# Patient Record
Sex: Female | Born: 1975 | Race: White | Hispanic: No | Marital: Married | Smoking: Former smoker
Health system: Southern US, Community
[De-identification: ages and names within clinical notes are randomized; demographics above are authoritative.]

## PROBLEM LIST (undated history)

## (undated) HISTORY — PX: COLONOSCOPY: SHX174

## (undated) HISTORY — PX: BREAST LUMPECTOMY: SHX2

---

## 2000-08-15 ENCOUNTER — Other Ambulatory Visit: Admission: RE | Admit: 2000-08-15 | Discharge: 2000-08-15 | Payer: Self-pay | Admitting: Obstetrics and Gynecology

## 2001-08-29 ENCOUNTER — Other Ambulatory Visit: Admission: RE | Admit: 2001-08-29 | Discharge: 2001-08-29 | Payer: Self-pay | Admitting: Obstetrics and Gynecology

## 2002-09-01 ENCOUNTER — Other Ambulatory Visit: Admission: RE | Admit: 2002-09-01 | Discharge: 2002-09-01 | Payer: Self-pay | Admitting: Obstetrics and Gynecology

## 2003-11-09 ENCOUNTER — Other Ambulatory Visit: Admission: RE | Admit: 2003-11-09 | Discharge: 2003-11-09 | Payer: Self-pay | Admitting: Obstetrics and Gynecology

## 2004-11-24 ENCOUNTER — Other Ambulatory Visit: Admission: RE | Admit: 2004-11-24 | Discharge: 2004-11-24 | Payer: Self-pay | Admitting: Obstetrics and Gynecology

## 2008-02-18 ENCOUNTER — Encounter: Admission: RE | Admit: 2008-02-18 | Discharge: 2008-02-18 | Payer: Self-pay | Admitting: Obstetrics and Gynecology

## 2008-03-11 ENCOUNTER — Ambulatory Visit (HOSPITAL_BASED_OUTPATIENT_CLINIC_OR_DEPARTMENT_OTHER): Admission: RE | Admit: 2008-03-11 | Discharge: 2008-03-11 | Payer: Self-pay | Admitting: General Surgery

## 2008-03-11 ENCOUNTER — Encounter (INDEPENDENT_AMBULATORY_CARE_PROVIDER_SITE_OTHER): Payer: Self-pay | Admitting: General Surgery

## 2008-03-16 ENCOUNTER — Encounter: Admission: RE | Admit: 2008-03-16 | Discharge: 2008-03-16 | Payer: Self-pay | Admitting: Obstetrics and Gynecology

## 2009-02-18 ENCOUNTER — Encounter: Admission: RE | Admit: 2009-02-18 | Discharge: 2009-02-18 | Payer: Self-pay | Admitting: Obstetrics and Gynecology

## 2010-02-08 ENCOUNTER — Inpatient Hospital Stay (HOSPITAL_COMMUNITY): Admission: AD | Admit: 2010-02-08 | Discharge: 2010-02-11 | Payer: Self-pay | Admitting: Obstetrics and Gynecology

## 2010-08-18 LAB — CBC
HCT: 32.4 % — ABNORMAL LOW (ref 36.0–46.0)
HCT: 38 % (ref 36.0–46.0)
Hemoglobin: 12.9 g/dL (ref 12.0–15.0)
MCH: 31.2 pg (ref 26.0–34.0)
MCH: 31.2 pg (ref 26.0–34.0)
MCHC: 33.7 g/dL (ref 30.0–36.0)
MCHC: 34.1 g/dL (ref 30.0–36.0)
MCV: 92.6 fL (ref 78.0–100.0)
Platelets: 148 10*3/uL — ABNORMAL LOW (ref 150–400)
RDW: 14.1 % (ref 11.5–15.5)
RDW: 14.2 % (ref 11.5–15.5)
WBC: 13.9 10*3/uL — ABNORMAL HIGH (ref 4.0–10.5)

## 2010-10-18 NOTE — Op Note (Signed)
NAMETREZURE, CRONK                ACCOUNT NO.:  [id number]   MEDICAL RECORD NO.:  [id number]          PATIENT TYPE:  AMB   LOCATION:  DSC                          FACILITY:  [facility]   PHYSICIAN:  Almond Lint, MD       DATE OF BIRTH:  Nov 18, 1975   DATE OF PROCEDURE:  03/11/2008  DATE OF DISCHARGE:                               OPERATIVE REPORT   PREOPERATIVE DIAGNOSIS:  Right breast mass.   POSTOPERATIVE DIAGNOSIS:  Right breast mass.   PROCEDURE PERFORMED:  Excision of right breast mass at 4 o'clock in the  lower inner quadrant.   SURGEON:  Almond Lint, MD   ASSISTANT:  None.   ANESTHESIA:  General and local.   FINDINGS:  Fatty right breast mass.   SPECIMEN:  Breast mass to pathology   ESTIMATED BLOOD LOSS:  Minimal.   COMPLICATIONS:  None.   PROCEDURE:  Ms. Brymer was identified in the holding area and taken to the  operating room where she was placed supine on the operating room table.  General anesthesia was induced.  Her right breast was prepped and draped  in a sterile fashion.  The presurgical time-out checklist was performed.  The lesion was marked and a potential mastectomy incision was marked.  A  curvilinear oblique incision was made on the lower inner quadrant of the  right breast.  The breast mass was immediately apparent under the skin.  Skin flaps were created to get around the mass.  This was done by using  skin tucks and using the Bovie electrocautery.  The mass was elevated  with an Allis and removed with the Bovie electrocautery.  The wound was  inspected for hemostasis which was the case.  The wound was then  irrigated.  There was another small site of oozing which was coagulated.  The wound was irrigated again.  The skin was then reapproximated using 3-  0 Vicryl interrupted and 4-0 Monocryl in a running fashion.  The skin  was cleaned and dressed with Dermabond.  The patient tolerated the  procedure well, was awakened and taken to the PACU in stable  condition.  The needle and sponge counts were correct x2.      Almond Lint, MD  Electronically Signed    FB/MEDQ  D:  03/11/2008  T:  03/12/2008  Job:  [id number]

## 2011-03-27 ENCOUNTER — Other Ambulatory Visit: Payer: Self-pay | Admitting: Obstetrics and Gynecology

## 2011-03-27 DIAGNOSIS — Z1231 Encounter for screening mammogram for malignant neoplasm of breast: Secondary | ICD-10-CM

## 2011-05-03 ENCOUNTER — Ambulatory Visit: Payer: Self-pay

## 2011-06-08 ENCOUNTER — Ambulatory Visit
Admission: RE | Admit: 2011-06-08 | Discharge: 2011-06-08 | Disposition: A | Discharge status: Home / Self Care | Payer: Managed Care, Other (non HMO) | Source: Ambulatory Visit | Attending: Obstetrics and Gynecology | Admitting: Obstetrics and Gynecology

## 2011-06-08 DIAGNOSIS — Z1231 Encounter for screening mammogram for malignant neoplasm of breast: Secondary | ICD-10-CM

## 2013-07-09 ENCOUNTER — Other Ambulatory Visit: Payer: Self-pay | Admitting: Obstetrics and Gynecology

## 2013-07-09 DIAGNOSIS — R928 Other abnormal and inconclusive findings on diagnostic imaging of breast: Secondary | ICD-10-CM

## 2013-07-21 ENCOUNTER — Ambulatory Visit
Admission: RE | Admit: 2013-07-21 | Discharge: 2013-07-21 | Disposition: A | Discharge status: Home / Self Care | Payer: Managed Care, Other (non HMO) | Source: Ambulatory Visit | Attending: Obstetrics and Gynecology | Admitting: Obstetrics and Gynecology

## 2013-07-21 DIAGNOSIS — R928 Other abnormal and inconclusive findings on diagnostic imaging of breast: Secondary | ICD-10-CM

## 2014-07-28 ENCOUNTER — Other Ambulatory Visit: Payer: Self-pay | Admitting: Obstetrics and Gynecology

## 2014-07-29 LAB — CYTOLOGY - PAP

## 2017-03-07 ENCOUNTER — Ambulatory Visit (INDEPENDENT_AMBULATORY_CARE_PROVIDER_SITE_OTHER): Payer: 59 | Admitting: Physician Assistant

## 2017-03-07 ENCOUNTER — Encounter: Payer: Self-pay | Admitting: Physician Assistant

## 2017-03-07 ENCOUNTER — Ambulatory Visit (INDEPENDENT_AMBULATORY_CARE_PROVIDER_SITE_OTHER): Payer: 59

## 2017-03-07 VITALS — BP 116/82 | HR 90 | Temp 99.0°F | Resp 16 | Ht 63.0 in | Wt 135.2 lb

## 2017-03-07 DIAGNOSIS — S99911A Unspecified injury of right ankle, initial encounter: Secondary | ICD-10-CM

## 2017-03-07 DIAGNOSIS — S82831A Other fracture of upper and lower end of right fibula, initial encounter for closed fracture: Secondary | ICD-10-CM

## 2017-03-07 NOTE — Patient Instructions (Signed)
Please keep the ankle boot on with any ambulation or walking.  I need to see you in 2 weeks to recheck this ankle fracture.  If this is not healing well, we will have to send you to orthopedist, but prepare to be in boot for 6 weeks. Nondisplaced Fibular Ankle Fracture Treated With Immobilization, Adult A nondisplaced fibular ankle fracture is a simple break of the bottom of the fibula (lateral malleolus). The fibula is a bone in the lower leg, between the knee and the foot. In a nondisplaced fracture, the pieces of the broken bone line up with each other and are not out of place. This condition usually does not need surgery and can be treated with a splint or cast. What are the causes? This condition may be caused by:  A hard, direct hit (blow) or injury to the side of the leg.  A powerful twisting or rotating movement.  Rolling the ankle.  Falling or tripping.  What increases the risk? You are more likely to develop this condition if:  You play sports that involve a lot of running and pivoting, such as basketball.  You play impact sports, such as football or soccer.  You smoke.  You have diabetes.  You have a history of ankle fractures.  You are obese.  What are the signs or symptoms? Symptoms of this condition include:  Severe pain that begins immediately after the injury.  Bruising.  Swelling.  Inability to put weight on the injured ankle.  An ankle that is tender to the touch.  How is this diagnosed? This condition is diagnosed based on:  Your medical history.  A physical exam.  Imaging tests to confirm the fracture and to evaluate the extent of the injury. These tests may include: ? X-rays. ? Stress X-ray. During this test, your health care provider will put pressure on your ankle while taking an X-ray. This will help to determine whether your ankle is stable. ? CT scan. ? MRI.  How is this treated? This condition may be treated with:  A  splint.  Icing and raising (elevating) the ankle.  A cast.  A removable cast or walking "boot."  Crutches. These may be needed to help you get around.  Follow these instructions at home: Managing pain, stiffness, and swelling  If directed, put ice on the injured area. ? If you have a removable splint or cast, remove it as told by your health care provider. ? Put ice in a plastic bag. ? Place a towel between your skin and the bag, or between your cast and the bag. ? Leave the ice on for 20 minutes, 2-3 times a day.  Raise (elevate) the injured area above the level of your heart while you are sitting or lying down.  Move your toes often to avoid stiffness and to lessen swelling.  Use crutches as directed. Resume walking without crutches as directed by your health care provider or when you are comfortable doing that. If you have a removable splint or cast:  Wear the removable splint or cast as told by your health care provider. Remove it only as told by your health care provider.  Loosen the splint or cast if your toes tingle, become numb, or turn cold and blue.  Keep the splint or cast clean.  If the splint or cast is not waterproof: ? Do not let it get wet. ? Cover it with a watertight covering when you take a bath or a shower. If  you have a cast that cannot be removed:  Do not stick anything inside the cast to scratch your skin. Doing that increases your risk of infection.  Check the skin around the cast every day. Contact your health care provider if you notice any redness, irritation, or swelling.  You may put lotion on dry skin around the edges of the cast. Do not put lotion on the skin underneath the cast.  Keep the cast clean.  Do not break off edges or trim your cast.  If the cast is not waterproof: ? Do not let it get wet. ? Cover it with a watertight covering when you take a bath or a shower. Activity  Do exercises and stretches as told by your health care  provider.  Ask your health care provider when it is safe to drive if you have a cast or splint on your ankle.  Do not drive or use heavy machinery while taking prescription pain medicine. General instructions  Take over-the-counter and prescription medicines only as told by your health care provider.  Do not take baths, swim, or use a hot tub until your health care provider approves. Ask your health care provider if you can take showers. You may only be allowed to take sponge baths for bathing.  Do not use the injured leg to support your body weight until your health care provider says that you can. Use crutches as told by your health care provider.  Do not use any products that contain nicotine or tobacco, such as cigarettes and e-cigarettes. These can delay bone healing. If you need help quitting, ask your health care provider.  Keep all follow-up visits as told by your health care provider. This is important. Contact a health care provider if:  Your cast gets damaged or it breaks.  Your pain does not get better with medicine. Get help right away if:  You develop severe pain or more swelling in your ankle or foot that cannot be controlled with medicines.  Your skin or nails below the injury turn blue or gray, feel cold, or become numb.  The skin under your cast burns or stings.  There is a bad smell or pus coming from under the cast.  You cannot move your toes. Summary  A nondisplaced fibular ankle fracture is a simple break of the bottom of a bone in the lower leg (fibula).  This condition may be treated with a splint, icing and elevation, a cast, or a removable cast or walking "boot." You may also need crutches to get around while your ankle heals.  To help manage pain, stiffness, swelling, put ice on the injured area as directed by your health care provider.  You should not use the injured leg to support your body weight until your health care provider says that you can.  Use crutches as told by your health care provider. This information is not intended to replace advice given to you by your health care provider. Make sure you discuss any questions you have with your health care provider. Document Released: 02/11/2002 Document Revised: 05/01/2016 Document Reviewed: 05/01/2016 Elsevier Interactive Patient Education  2017 [employer].

## 2017-03-07 NOTE — Progress Notes (Signed)
PRIMARY CARE AT [facility] [address] [place] [address] [phone]  Date:  03/07/2017   Name:  Katie Snow   DOB:  11-08-1975   MRN:  [id number]  PCP:  Garnetta Buddy, PA    History of Present Illness:  Katie Snow is a 41 y.o. female patient who presents to PCP with  Chief Complaint  Patient presents with  . Ankle Injury    stepped on acorn while running     Morning, running and slipped, inverting her ankle.  Pain and swelling insued.  She had to trek 1.5 miles back walking.  She has pain on the outside of her ankle.  She reports that she has never injured her ankle before.  No numbness or tingling.     There are no active problems to display for this patient.   History reviewed. No pertinent past medical history.  History reviewed. No pertinent surgical history.  Social History  Substance Use Topics  . Smoking status: Never Smoker  . Smokeless tobacco: Never Used  . Alcohol use Not on file    History reviewed. No pertinent family history.  No Known Allergies  Medication list has been reviewed and updated.  No current outpatient prescriptions on file prior to visit.   No current facility-administered medications on file prior to visit.     ROS ROS otherwise unremarkable unless listed above.  Physical Examination: BP 116/82   Pulse 90   Temp 99 F (37.2 C)   Resp 16   Ht  (1.6 m)   Wt 135 lb 3.2 oz (61.3 kg)   SpO2 98%   BMI 23.95 kg/m  Ideal Body Weight: Weight in (lb) to have BMI = 25: 140.8  Physical Exam  Constitutional: She is oriented to person, place, and time. She appears well-developed and well-nourished. No distress.  HENT:  Head: Normocephalic and atraumatic.  Right Ear: External ear normal.  Left Ear: External ear normal.  Eyes: Pupils are equal, round, and reactive to light. Conjunctivae and EOM are normal.  Cardiovascular: Normal rate.   Pulmonary/Chest: Effort normal. No respiratory distress.  Musculoskeletal:      Right ankle: She exhibits decreased range of motion and swelling. She exhibits no ecchymosis. Tenderness. Lateral malleolus tenderness found. Achilles tendon normal.   tender at the anterior area of the right lateral malleolus.  pain with passive eversion and flexion.  Neurological: She is alert and oriented to person, place, and time.  Skin: She is not diaphoretic.  Psychiatric: She has a normal mood and affect. Her behavior is normal.    Dg Ankle Complete Right  Result Date: 03/07/2017 CLINICAL DATA:  Right ankle pain after inverting injury. EXAM: RIGHT ANKLE - COMPLETE 3+ VIEW COMPARISON:  None. FINDINGS: There is a oblique nondisplaced fracture of the distal fibular metaphysis with overlying soft tissue swelling. There is no evidence of arthropathy or other focal bone abnormality. Soft tissues are unremarkable. IMPRESSION: Oblique nondisplaced fracture of the distal fibular metaphysis with overlying soft tissue swelling. Electronically Signed   By: Elige Ko   On: 03/07/2017 15:42     Assessment and Plan: Katie Snow is a 41 y.o. female who is here today for cc of  Chief Complaint  Patient presents with  . Ankle Injury    stepped on acorn while running   Placed in boot.  Advised must be worn for any ambulation.  She will return in 2 weeks for recheck xrays and followup.  First offered ortho  referral, but she would like to follow this here in clinic.  If healing is not prevalent after 2 weeks, may seek referral.   Other closed fracture of distal end of right fibula, initial encounter  Injury of right ankle, initial encounter - Plan: DG Ankle Complete Right  Trena Platt, PA-C [facility] 10/7/201810:55 AM

## 2017-03-26 ENCOUNTER — Ambulatory Visit (INDEPENDENT_AMBULATORY_CARE_PROVIDER_SITE_OTHER): Payer: 59 | Admitting: Physician Assistant

## 2017-03-26 ENCOUNTER — Encounter: Payer: Self-pay | Admitting: Physician Assistant

## 2017-03-26 ENCOUNTER — Ambulatory Visit (INDEPENDENT_AMBULATORY_CARE_PROVIDER_SITE_OTHER): Payer: 59

## 2017-03-26 VITALS — BP 114/62 | HR 72 | Temp 98.6°F | Resp 16 | Ht 63.0 in | Wt 131.0 lb

## 2017-03-26 DIAGNOSIS — S82831A Other fracture of upper and lower end of right fibula, initial encounter for closed fracture: Secondary | ICD-10-CM | POA: Diagnosis not present

## 2017-03-26 DIAGNOSIS — M25572 Pain in left ankle and joints of left foot: Secondary | ICD-10-CM

## 2017-03-26 NOTE — Progress Notes (Signed)
PRIMARY CARE AT [facility] [address] [place] [address] [phone]  Date:  03/26/2017   Name:  Katie Snow   DOB:  11-Jul-1975   MRN:  [id number]  PCP:  Garnetta Buddy, PA    History of Present Illness:  Katie Snow is a 41 y.o. female patient who presents to PCP with  Chief Complaint  Patient presents with  . Follow-up    ankle     Left ankle fracture of the distal fibular metaphysis that occurred 2.5 weeks ago.  She was placed in a cam walker boot for ambulation only.  She states that she has kept up with this.  The swelling and pain is minimal she states.     There are no active problems to display for this patient.   History reviewed. No pertinent past medical history.  History reviewed. No pertinent surgical history.  Social History  Substance Use Topics  . Smoking status: Never Smoker  . Smokeless tobacco: Never Used  . Alcohol use Not on file    History reviewed. No pertinent family history.  No Known Allergies  Medication list has been reviewed and updated.  No current outpatient prescriptions on file prior to visit.   No current facility-administered medications on file prior to visit.     ROS ROS otherwise unremarkable unless listed above.  Physical Examination: BP 114/62   Pulse 72   Temp 98.6 F (37 C) (Oral)   Resp 16   Ht 5\' 3"  (1.6 m)   Wt 131 lb (59.4 kg)   SpO2 100%   BMI 23.21 kg/m  Ideal Body Weight: Weight in (lb) to have BMI = 25: 140.8  Physical Exam  Constitutional: She is oriented to person, place, and time. She appears well-developed and well-nourished. No distress.  HENT:  Head: Normocephalic and atraumatic.  Right Ear: External ear normal.  Left Ear: External ear normal.  Eyes: Pupils are equal, round, and reactive to light. Conjunctivae and EOM are normal.  Cardiovascular: Normal rate.   Pulmonary/Chest: Effort normal. No respiratory distress.  Musculoskeletal:  Tender along the distal fibular   Neurological: She is alert and oriented to person, place, and time.  Skin: She is not diaphoretic.  Psychiatric: She has a normal mood and affect. Her behavior is normal.     Dg Ankle Complete Left  Result Date: 03/26/2017 CLINICAL DATA:  Recent fracture EXAM: LEFT ANKLE COMPLETE - 3+ VIEW COMPARISON:  March 07, 2017 FINDINGS: Frontal, oblique, and lateral images obtained. The fracture of the distal fibular metaphysis is again noted without appreciable change in alignment compared to recent study. Alignment is essentially anatomic in this area. There is no appreciable callus currently in this area. No new fracture. Ankle mortise appears intact. No joint effusion. No appreciable joint space narrowing. There is a tiny exostosis along the medial talus. There is a small posterior calcaneal spur. IMPRESSION: Stable nondisplaced fracture distal fibular metaphysis without appreciable callus formation. No new fracture. Ankle mortise appears intact. There is mild soft tissue swelling. No joint effusion. There is a small posterior calcaneal spur. Electronically Signed   By: Bretta Bang III M.D.   On: 03/26/2017 08:58   Dg Ankle Complete Right  Result Date: 03/07/2017 CLINICAL DATA:  Right ankle pain after inverting injury. EXAM: RIGHT ANKLE - COMPLETE 3+ VIEW COMPARISON:  None. FINDINGS: There is a oblique nondisplaced fracture of the distal fibular metaphysis with overlying soft tissue swelling. There is no evidence of arthropathy or other focal  bone abnormality. Soft tissues are unremarkable. IMPRESSION: Oblique nondisplaced fracture of the distal fibular metaphysis with overlying soft tissue swelling. Electronically Signed   By: Elige KoHetal  Patel   On: 03/07/2017 15:42    Assessment and Plan: Denton LankHeather Arnott is a 41 y.o. female who is here today for cc  Chief Complaint  Patient presents with  . Follow-up    ankle  continue to stay in cam walker boot Follow up in 3 weeks for recheck.  Acute left  ankle pain - Plan: DG Ankle Complete Left  Other closed fracture of distal end of right fibula, initial encounter - Plan: DG Ankle Complete Left  Trena PlattStephanie English, PA-C [facility] 10/22/20189:46 AM

## 2017-03-26 NOTE — Patient Instructions (Addendum)
Please continue to stay in the boot when you are ambulating.       IF you received an x-ray today, you will receive an invoice from [facility]. Please contact [facility] at [phone] with questions or concerns regarding your invoice.   IF you received labwork today, you will receive an invoice from [facility]. Please contact [facility] at [phone] with questions or concerns regarding your invoice.   Our billing staff will not be able to assist you with questions regarding bills from these companies.  You will be contacted with the lab results as soon as they are available. The fastest way to get your results is to activate your My Chart account. Instructions are located on the last page of this paperwork. If you have not heard from us regarding the results in 2 weeks, please contact this office.

## 2017-04-23 ENCOUNTER — Ambulatory Visit (INDEPENDENT_AMBULATORY_CARE_PROVIDER_SITE_OTHER): Payer: 59

## 2017-04-23 ENCOUNTER — Other Ambulatory Visit: Payer: Self-pay | Admitting: Physician Assistant

## 2017-04-23 ENCOUNTER — Encounter: Payer: Self-pay | Admitting: Physician Assistant

## 2017-04-23 ENCOUNTER — Other Ambulatory Visit: Payer: Self-pay

## 2017-04-23 ENCOUNTER — Ambulatory Visit (INDEPENDENT_AMBULATORY_CARE_PROVIDER_SITE_OTHER): Payer: 59 | Admitting: Physician Assistant

## 2017-04-23 VITALS — BP 118/80 | HR 76 | Temp 98.1°F | Resp 16 | Ht 63.0 in | Wt 134.0 lb

## 2017-04-23 DIAGNOSIS — S82892A Other fracture of left lower leg, initial encounter for closed fracture: Secondary | ICD-10-CM

## 2017-04-23 NOTE — Progress Notes (Signed)
[facility] [address], [place] [address] [phone]  Date:  04/23/2017   Name:  Katie LankHeather Snow   DOB:  [birth year]   MRN:  [id number]  PCP:  Garnetta BuddyEnglish, Katie D, PA    History of Present Illness:  Katie Snow is a 41 y.o. female patient who presents to PCP with No chief complaint on file.      There are no active problems to display for this patient.   No past medical history on file.  No past surgical history on file.  Social History   Tobacco Use  . Smoking status: Never Smoker  . Smokeless tobacco: Never Used  Substance Use Topics  . Alcohol use: Not on file  . Drug use: Not on file    No family history on file.  No Known Allergies  Medication list has been reviewed and updated.  No current outpatient medications on file prior to visit.   No current facility-administered medications on file prior to visit.     ROS ROS otherwise unremarkable unless listed above.  Physical Examination: There were no vitals taken for this visit. Ideal Body Weight:    Physical Exam  Dg Ankle Complete Left  Result Date: 03/26/2017 CLINICAL DATA:  Recent fracture EXAM: LEFT ANKLE COMPLETE - 3+ VIEW COMPARISON:  March 07, 2017 FINDINGS: Frontal, oblique, and lateral images obtained. The fracture of the distal fibular metaphysis is again noted without appreciable change in alignment compared to recent study. Alignment is essentially anatomic in this area. There is no appreciable callus currently in this area. No new fracture. Ankle mortise appears intact. No joint effusion. No appreciable joint space narrowing. There is a tiny exostosis along the medial talus. There is a small posterior calcaneal spur. IMPRESSION: Stable nondisplaced fracture distal fibular metaphysis without appreciable callus formation. No new fracture. Ankle mortise appears intact. There is mild soft tissue swelling. No joint effusion. There is a small posterior calcaneal spur. Electronically  Signed   By: Bretta BangWilliam  Woodruff III M.D.   On: 03/26/2017 08:58   Dg Ankle Complete Right  Result Date: 04/23/2017 CLINICAL DATA:  Left ankle fracture. EXAM: RIGHT ANKLE - COMPLETE 3+ VIEW COMPARISON:  Radiographs of March 07, 2017. FINDINGS: Stable appearance of nondisplaced fracture involving the distal right fibula. No significant callus formation is seen at this time to suggest healing. Persistent fracture line remains. Joint spaces are intact. No other fracture or bony abnormality is noted. No soft tissue abnormality is noted. IMPRESSION: Stable appearance of nondisplaced distal right fibular fracture. Electronically Signed   By: Lupita RaiderJames  Green Jr, M.D.   On: 04/23/2017 09:06     Assessment and Plan: Katie Snow is a 41 y.o. female who is here today  1. Closed fracture of left ankle, initial encounter - DG Ankle Complete Right   Trena PlattStephanie English, PA-C [facility] 04/23/2017 9:16 AM

## 2017-04-23 NOTE — Patient Instructions (Addendum)
I am referring you to orthopedist.  I would like you to await contact for this appointment.   Continue to stay in the boot.      IF you received an x-ray today, you will receive an invoice from [facility]. Please contact [facility] at [phone] with questions or concerns regarding your invoice.   IF you received labwork today, you will receive an invoice from [facility]. Please contact [facility] at [phone] with questions or concerns regarding your invoice.   Our billing staff will not be able to assist you with questions regarding bills from these companies.  You will be contacted with the lab results as soon as they are available. The fastest way to get your results is to activate your My Chart account. Instructions are located on the last page of this paperwork. If you have not heard from us regarding the results in 2 weeks, please contact this office.

## 2017-04-23 NOTE — Progress Notes (Signed)
[facility] [address], [place] [phone]  Date:  04/23/2017   Name:  Katie LankHeather Snow   DOB:  09/05/1975   MRN:  [id number]  PCP:  Garnetta BuddyEnglish, Ruby Logiudice D, PA    History of Present Illness:  Katie Snow is a 41 y.o. female patient who presents to PCP with  Chief Complaint  Patient presents with  . Follow-up    ankle injury      Patient is here today for recheck of her ankle fracture.  About 6 weeks ago, she was diagnosed with an oblique fracture of her right fibula.  She has been in a boot WB for the last 6 weeks.   Past xrays did not show any calcifications of her ankle fracture.  Continued boot.  She reports that she is not exercising.  She only bears weight in the boot.   She has very little pain but once in a while.    There are no active problems to display for this patient.   No past medical history on file.  No past surgical history on file.  Social History   Tobacco Use  . Smoking status: Never Smoker  . Smokeless tobacco: Never Used  Substance Use Topics  . Alcohol use: Not on file  . Drug use: Not on file    No family history on file.  No Known Allergies  Medication list has been reviewed and updated.  No current outpatient medications on file prior to visit.   No current facility-administered medications on file prior to visit.     ROS ROS otherwise unremarkable unless listed above.  Physical Examination: BP 118/80   Pulse 76   Temp 98.1 F (36.7 C) (Oral)   Resp 16   Ht 5\' 3"  (1.6 m)   Wt 134 lb (60.8 kg)   SpO2 100%   BMI 23.74 kg/m  Ideal Body Weight: Weight in (lb) to have BMI = 25: 140.8  Physical Exam  Constitutional: She is oriented to person, place, and time. She appears well-developed and well-nourished. No distress.  HENT:  Head: Normocephalic and atraumatic.  Right Ear: External ear normal.  Left Ear: External ear normal.  Eyes: Conjunctivae and EOM are normal. Pupils are equal, round, and  reactive to light.  Cardiovascular: Normal rate.  Pulmonary/Chest: Effort normal. No respiratory distress.  Musculoskeletal:  No tenderness of the right ankle upon palpation.  No noticeable swelling.    Neurological: She is alert and oriented to person, place, and time.  Skin: She is not diaphoretic.  Psychiatric: She has a normal mood and affect. Her behavior is normal.    Dg Ankle Complete Left  Result Date: 03/26/2017 CLINICAL DATA:  Recent fracture EXAM: LEFT ANKLE COMPLETE - 3+ VIEW COMPARISON:  March 07, 2017 FINDINGS: Frontal, oblique, and lateral images obtained. The fracture of the distal fibular metaphysis is again noted without appreciable change in alignment compared to recent study. Alignment is essentially anatomic in this area. There is no appreciable callus currently in this area. No new fracture. Ankle mortise appears intact. No joint effusion. No appreciable joint space narrowing. There is a tiny exostosis along the medial talus. There is a small posterior calcaneal spur. IMPRESSION: Stable nondisplaced fracture distal fibular metaphysis without appreciable callus formation. No new fracture. Ankle mortise appears intact. There is mild soft tissue swelling. No joint effusion. There is a small posterior calcaneal spur. Electronically Signed   By: Bretta BangWilliam  Woodruff III M.D.   On: 03/26/2017  08:58   Dg Ankle Complete Left  Result Date: 03/26/2017 CLINICAL DATA:  Recent fracture EXAM: LEFT ANKLE COMPLETE - 3+ VIEW COMPARISON:  March 07, 2017 FINDINGS: Frontal, oblique, and lateral images obtained. The fracture of the distal fibular metaphysis is again noted without appreciable change in alignment compared to recent study. Alignment is essentially anatomic in this area. There is no appreciable callus currently in this area. No new fracture. Ankle mortise appears intact. No joint effusion. No appreciable joint space narrowing. There is a tiny exostosis along the medial talus. There is  a small posterior calcaneal spur. IMPRESSION: Stable nondisplaced fracture distal fibular metaphysis without appreciable callus formation. No new fracture. Ankle mortise appears intact. There is mild soft tissue swelling. No joint effusion. There is a small posterior calcaneal spur. Electronically Signed   By: Bretta BangWilliam  Woodruff III M.D.   On: 03/26/2017 08:58   Dg Ankle Complete Right  Result Date: 04/23/2017 CLINICAL DATA:  Left ankle fracture. EXAM: RIGHT ANKLE - COMPLETE 3+ VIEW COMPARISON:  Radiographs of March 07, 2017. FINDINGS: Stable appearance of nondisplaced fracture involving the distal right fibula. No significant callus formation is seen at this time to suggest healing. Persistent fracture line remains. Joint spaces are intact. No other fracture or bony abnormality is noted. No soft tissue abnormality is noted. IMPRESSION: Stable appearance of nondisplaced distal right fibular fracture. Electronically Signed   By: Lupita RaiderJames  Green Jr, M.D.   On: 04/23/2017 09:06    Assessment and Plan: Katie Snow is a 41 y.o. female who is here today for cc of  Chief Complaint  Patient presents with  . Follow-up    ankle injury   Patient continues to have difficulty with right ankle fracture healing, though stable.  She is very active and athlete.  It would be advantageous for orthopedic consult at this time.  She has inquired of 1 mile marathon in cam boot.  I advised against.   Obtained vitamin d today . Closed fracture of left ankle, initial encounter - Plan: AMB referral to orthopedics, VITAMIN D 25 Hydroxy (Vit-D Deficiency, Fractures), CANCELED: DG Ankle Complete Left  Trena PlattStephanie Fatma Rutten, PA-C [facility] 11/19/20182:05 PM

## 2017-04-24 LAB — VITAMIN D 25 HYDROXY (VIT D DEFICIENCY, FRACTURES): VIT D 25 HYDROXY: 61.1 ng/mL (ref 30.0–100.0)

## 2017-09-12 ENCOUNTER — Encounter: Payer: Self-pay | Admitting: Physician Assistant

## 2018-04-24 DIAGNOSIS — Z803 Family history of malignant neoplasm of breast: Secondary | ICD-10-CM | POA: Diagnosis not present

## 2018-04-24 DIAGNOSIS — Z01419 Encounter for gynecological examination (general) (routine) without abnormal findings: Secondary | ICD-10-CM | POA: Diagnosis not present

## 2018-04-24 DIAGNOSIS — Z1231 Encounter for screening mammogram for malignant neoplasm of breast: Secondary | ICD-10-CM | POA: Diagnosis not present

## 2018-04-24 DIAGNOSIS — Z800 Family history of malignant neoplasm of digestive organs: Secondary | ICD-10-CM | POA: Diagnosis not present

## 2018-04-24 DIAGNOSIS — Z809 Family history of malignant neoplasm, unspecified: Secondary | ICD-10-CM | POA: Diagnosis not present

## 2018-04-24 DIAGNOSIS — Z8042 Family history of malignant neoplasm of prostate: Secondary | ICD-10-CM | POA: Diagnosis not present

## 2018-04-24 DIAGNOSIS — Z6823 Body mass index (BMI) 23.0-23.9, adult: Secondary | ICD-10-CM | POA: Diagnosis not present

## 2018-04-24 DIAGNOSIS — Z801 Family history of malignant neoplasm of trachea, bronchus and lung: Secondary | ICD-10-CM | POA: Diagnosis not present

## 2018-04-24 DIAGNOSIS — E785 Hyperlipidemia, unspecified: Secondary | ICD-10-CM | POA: Diagnosis not present

## 2018-04-24 DIAGNOSIS — Z8049 Family history of malignant neoplasm of other genital organs: Secondary | ICD-10-CM | POA: Diagnosis not present

## 2018-04-30 ENCOUNTER — Other Ambulatory Visit: Payer: Self-pay | Admitting: Obstetrics and Gynecology

## 2018-04-30 DIAGNOSIS — Z803 Family history of malignant neoplasm of breast: Secondary | ICD-10-CM

## 2018-05-22 DIAGNOSIS — Z1211 Encounter for screening for malignant neoplasm of colon: Secondary | ICD-10-CM | POA: Diagnosis not present

## 2018-05-22 DIAGNOSIS — Z800 Family history of malignant neoplasm of digestive organs: Secondary | ICD-10-CM | POA: Diagnosis not present

## 2018-06-12 DIAGNOSIS — Z809 Family history of malignant neoplasm, unspecified: Secondary | ICD-10-CM | POA: Diagnosis not present

## 2018-06-27 DIAGNOSIS — Z800 Family history of malignant neoplasm of digestive organs: Secondary | ICD-10-CM | POA: Diagnosis not present

## 2018-06-27 DIAGNOSIS — Z1211 Encounter for screening for malignant neoplasm of colon: Secondary | ICD-10-CM | POA: Diagnosis not present

## 2018-06-27 DIAGNOSIS — D127 Benign neoplasm of rectosigmoid junction: Secondary | ICD-10-CM | POA: Diagnosis not present

## 2018-06-27 DIAGNOSIS — K635 Polyp of colon: Secondary | ICD-10-CM | POA: Diagnosis not present

## 2018-07-02 DIAGNOSIS — K635 Polyp of colon: Secondary | ICD-10-CM | POA: Diagnosis not present

## 2018-07-02 DIAGNOSIS — Z1211 Encounter for screening for malignant neoplasm of colon: Secondary | ICD-10-CM | POA: Diagnosis not present

## 2018-07-02 DIAGNOSIS — D127 Benign neoplasm of rectosigmoid junction: Secondary | ICD-10-CM | POA: Diagnosis not present

## 2019-02-03 IMAGING — DX DG ANKLE COMPLETE 3+V*L*
4 series · 4 of 4 positions shown · non-contrast
Comparison: March 07, 2017

CLINICAL DATA: Recent fracture

EXAM:
LEFT ANKLE COMPLETE - 3+ VIEW

[ankle obl (1 of 2)]
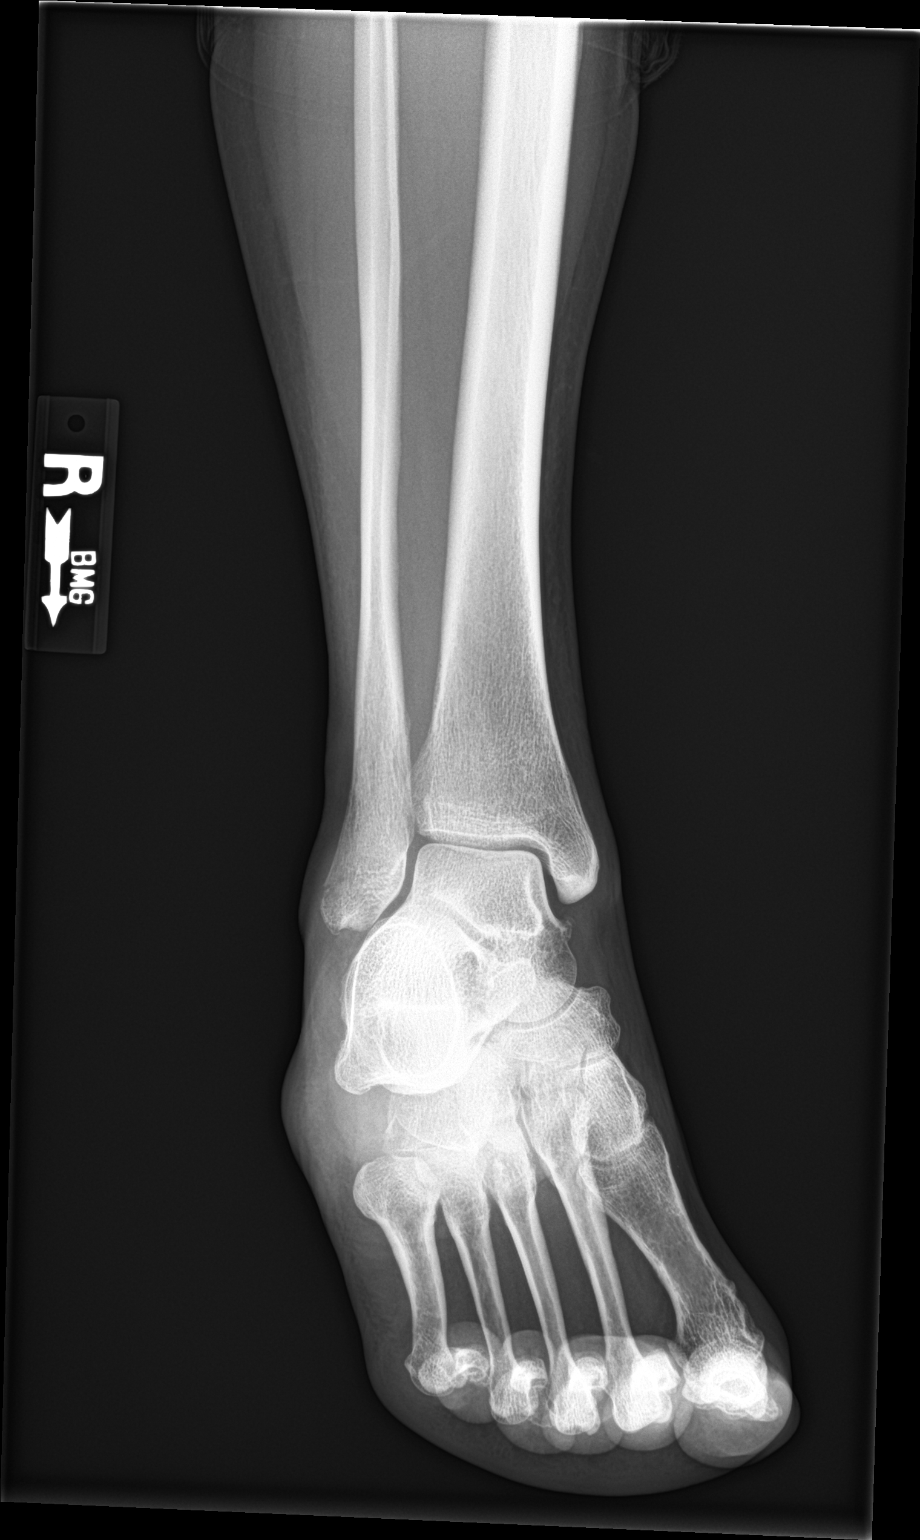

[ankle ap]
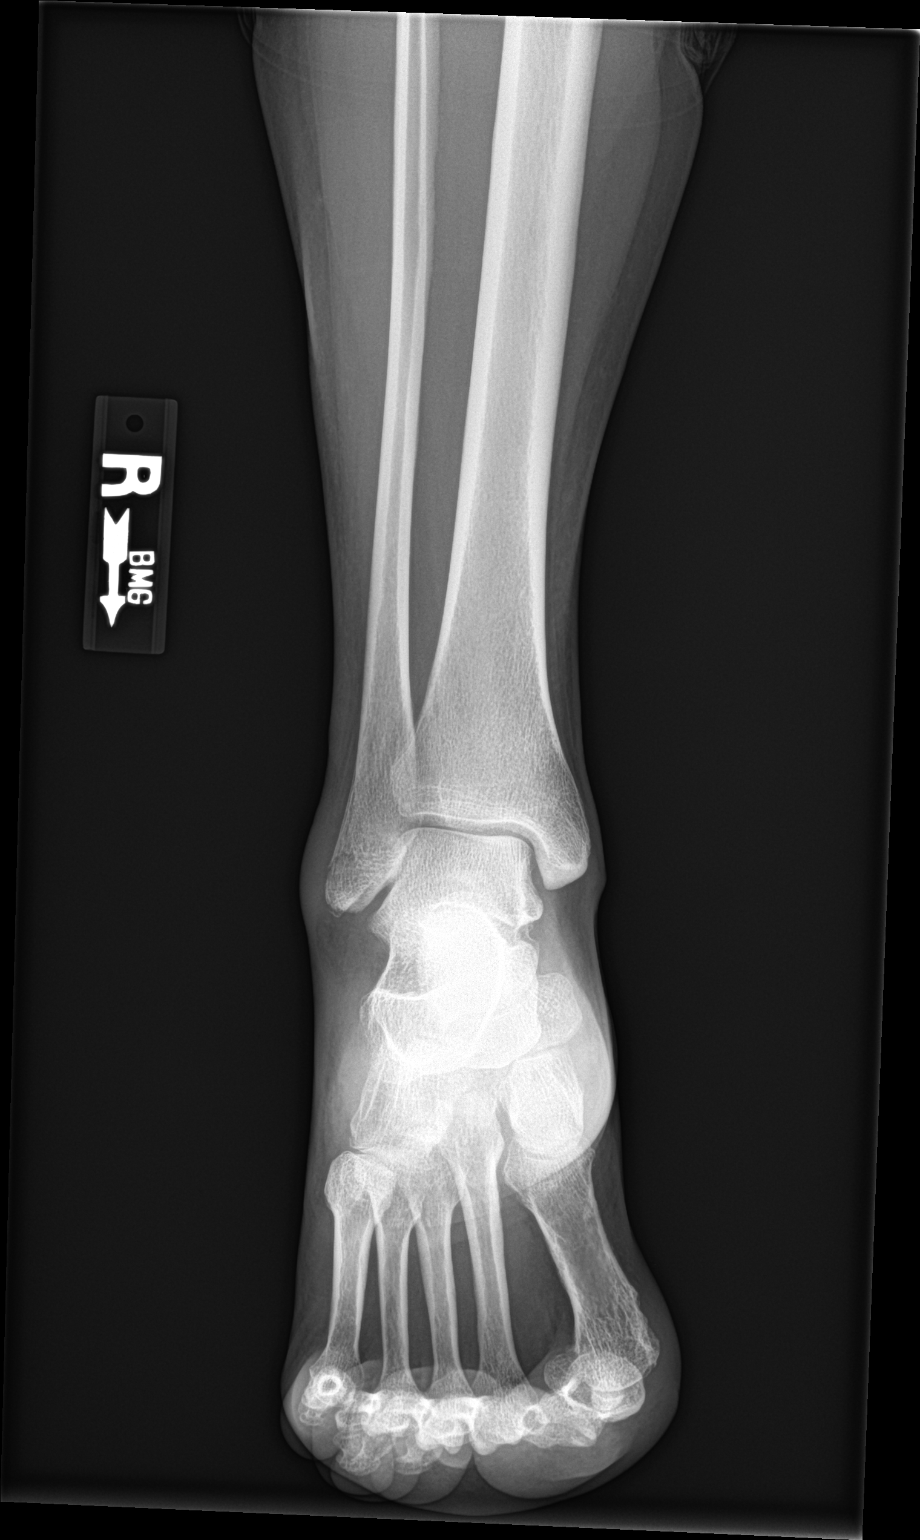

[ankle lat]
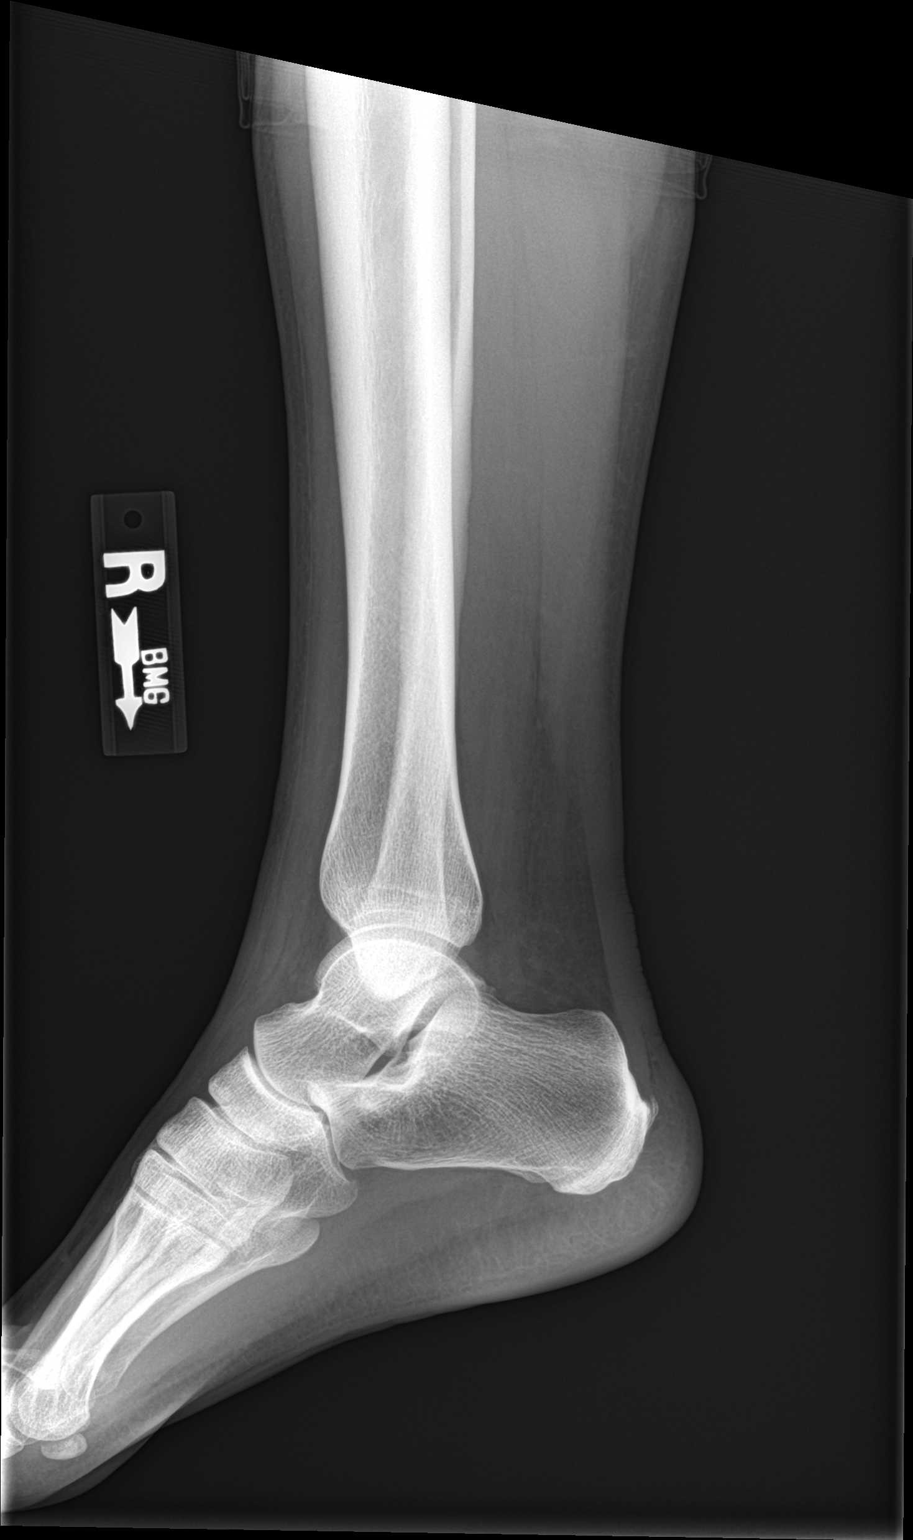

[ankle obl (2 of 2)]
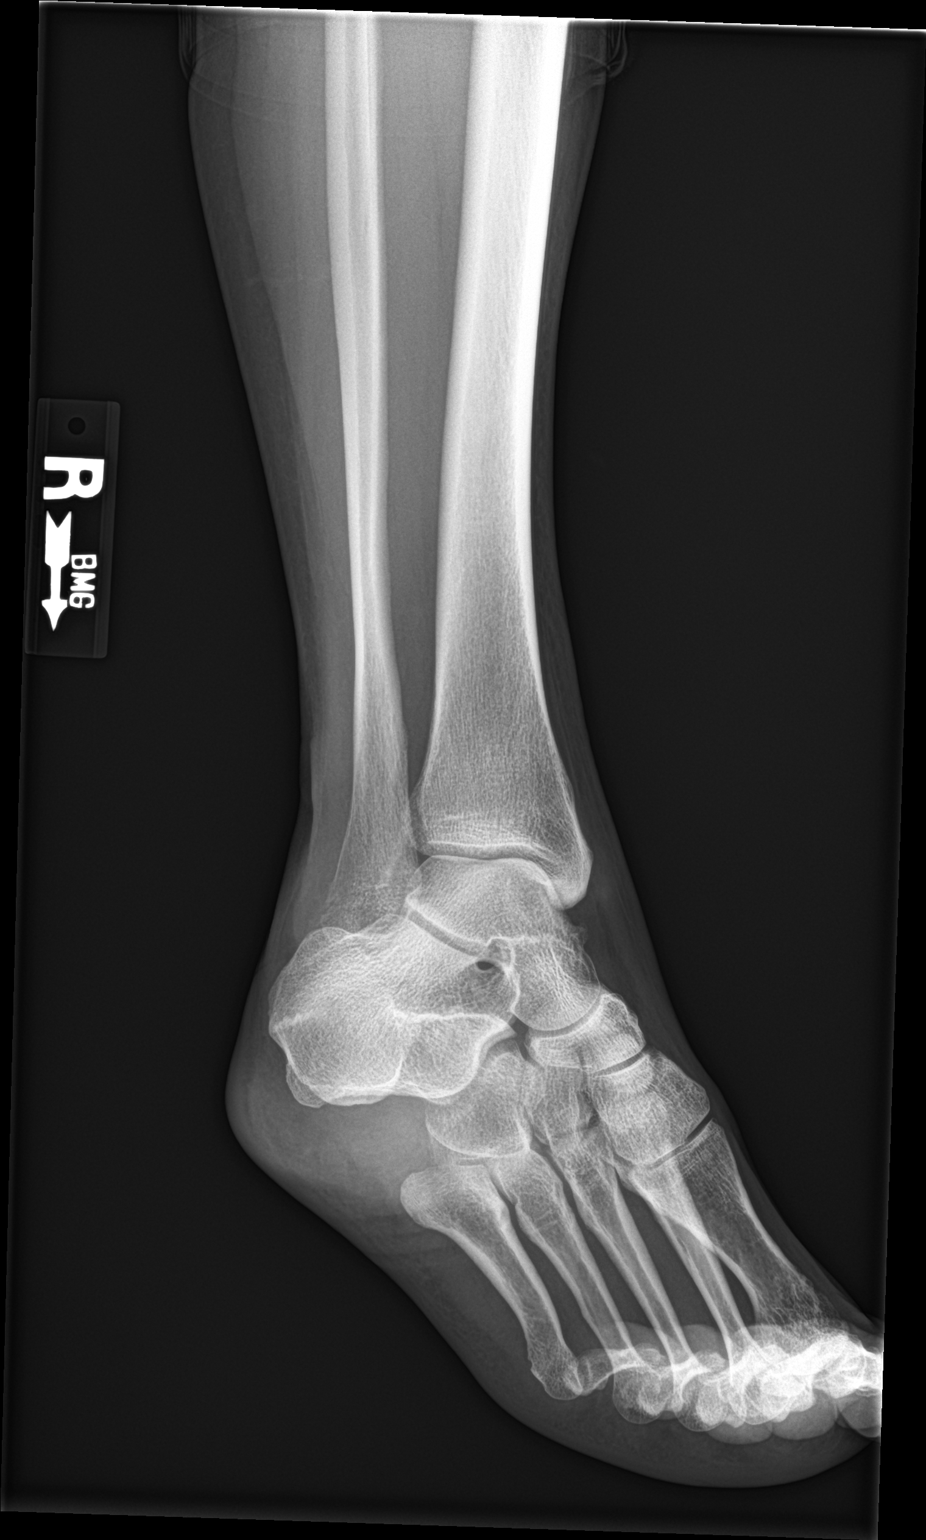

[4 of 4 positions shown; findings below may reference images not displayed]

FINDINGS: Frontal, oblique, and lateral images obtained. The fracture of the
distal fibular metaphysis is again noted without appreciable change
in alignment compared to recent study. Alignment is essentially
anatomic in this area. There is no appreciable callus currently in
this area. No new fracture. Ankle mortise appears intact. No joint
effusion. No appreciable joint space narrowing. There is a tiny
exostosis along the medial talus. There is a small posterior
calcaneal spur.
IMPRESSION: Stable nondisplaced fracture distal fibular metaphysis without
appreciable callus formation. No new fracture. Ankle mortise appears
intact. There is mild soft tissue swelling. No joint effusion. There
is a small posterior calcaneal spur.

## 2020-04-14 DIAGNOSIS — Z30433 Encounter for removal and reinsertion of intrauterine contraceptive device: Secondary | ICD-10-CM | POA: Diagnosis not present

## 2020-09-01 DIAGNOSIS — B373 Candidiasis of vulva and vagina: Secondary | ICD-10-CM | POA: Diagnosis not present

## 2020-09-01 DIAGNOSIS — L292 Pruritus vulvae: Secondary | ICD-10-CM | POA: Diagnosis not present

## 2020-09-23 DIAGNOSIS — Z13228 Encounter for screening for other metabolic disorders: Secondary | ICD-10-CM | POA: Diagnosis not present

## 2020-09-23 DIAGNOSIS — Z6824 Body mass index (BMI) 24.0-24.9, adult: Secondary | ICD-10-CM | POA: Diagnosis not present

## 2020-09-23 DIAGNOSIS — Z1329 Encounter for screening for other suspected endocrine disorder: Secondary | ICD-10-CM | POA: Diagnosis not present

## 2020-09-23 DIAGNOSIS — Z13220 Encounter for screening for lipoid disorders: Secondary | ICD-10-CM | POA: Diagnosis not present

## 2020-09-23 DIAGNOSIS — Z01419 Encounter for gynecological examination (general) (routine) without abnormal findings: Secondary | ICD-10-CM | POA: Diagnosis not present

## 2020-09-23 DIAGNOSIS — Z1321 Encounter for screening for nutritional disorder: Secondary | ICD-10-CM | POA: Diagnosis not present

## 2020-09-27 DIAGNOSIS — Z1231 Encounter for screening mammogram for malignant neoplasm of breast: Secondary | ICD-10-CM | POA: Diagnosis not present

## 2020-10-01 ENCOUNTER — Other Ambulatory Visit: Payer: Self-pay | Admitting: Obstetrics and Gynecology

## 2020-10-01 DIAGNOSIS — Z803 Family history of malignant neoplasm of breast: Secondary | ICD-10-CM

## 2021-11-15 ENCOUNTER — Other Ambulatory Visit: Payer: Self-pay | Admitting: Obstetrics and Gynecology

## 2021-11-15 DIAGNOSIS — Z01419 Encounter for gynecological examination (general) (routine) without abnormal findings: Secondary | ICD-10-CM | POA: Diagnosis not present

## 2021-11-15 DIAGNOSIS — Z8249 Family history of ischemic heart disease and other diseases of the circulatory system: Secondary | ICD-10-CM

## 2021-11-15 DIAGNOSIS — Z1231 Encounter for screening mammogram for malignant neoplasm of breast: Secondary | ICD-10-CM | POA: Diagnosis not present

## 2021-11-15 DIAGNOSIS — Z6825 Body mass index (BMI) 25.0-25.9, adult: Secondary | ICD-10-CM | POA: Diagnosis not present

## 2022-10-10 ENCOUNTER — Ambulatory Visit (INDEPENDENT_AMBULATORY_CARE_PROVIDER_SITE_OTHER): Payer: 59

## 2022-10-10 ENCOUNTER — Ambulatory Visit
Admission: EM | Admit: 2022-10-10 | Discharge: 2022-10-10 | Disposition: A | Discharge status: Home / Self Care | Payer: 59 | Attending: Internal Medicine | Admitting: Internal Medicine

## 2022-10-10 DIAGNOSIS — M25562 Pain in left knee: Secondary | ICD-10-CM

## 2022-10-10 DIAGNOSIS — S86912A Strain of unspecified muscle(s) and tendon(s) at lower leg level, left leg, initial encounter: Secondary | ICD-10-CM | POA: Diagnosis not present

## 2022-10-10 MED ORDER — NAPROXEN 500 MG PO TABS: 500.0000 mg | ORAL_TABLET | Freq: Two times a day (BID) | ORAL | 0 refills | Status: AC

## 2022-10-10 NOTE — ED Triage Notes (Addendum)
Pt c/o injury to left knee during martial arts 5/4-c/o pain and swelling-NAD-limping gait

## 2022-10-10 NOTE — ED Provider Notes (Signed)
[facility]  Note:  This document was prepared using Conservation officer, historic buildings and may include unintentional dictation errors.  MRN: [id number] DOB: 1975/07/19  Subjective:   Katie Snow is a 47 y.o. female presenting for 2 to 3-day history persistent left knee pain, swelling.  Patient does martial arts and ended up injuring her knee while she was attempting to maneuver during sparring.  No current facility-administered medications for this encounter. No current outpatient medications on file.   No Known Allergies  History reviewed. No pertinent past medical history.   History reviewed. No pertinent surgical history.  No family history on file.  Social History   Tobacco Use   Smoking status: Former    Types: Cigarettes   Smokeless tobacco: Never  Vaping Use   Vaping Use: Never used  Substance Use Topics   Alcohol use: Yes    Comment: occ   Drug use: Never    ROS   Objective:   Vitals: BP (!) 143/83 (BP Location: Left Arm)   Pulse 79   Temp 98.3 F (36.8 C) (Oral)   Resp 16   SpO2 99%   Physical Exam Constitutional:      General: She is not in acute distress.    Appearance: Normal appearance. She is well-developed. She is not ill-appearing, toxic-appearing or diaphoretic.  HENT:     Head: Normocephalic and atraumatic.     Nose: Nose normal.     Mouth/Throat:     Mouth: Mucous membranes are moist.  Eyes:     General: No scleral icterus.       Right eye: No discharge.        Left eye: No discharge.     Extraocular Movements: Extraocular movements intact.  Cardiovascular:     Rate and Rhythm: Normal rate.  Pulmonary:     Effort: Pulmonary effort is normal.  Musculoskeletal:     Left knee: Swelling present. No deformity, effusion, erythema, ecchymosis, lacerations, bony tenderness or crepitus. Decreased range of motion. Tenderness present over the lateral joint line. No medial joint line or patellar tendon tenderness.  Normal alignment and normal patellar mobility.  Skin:    General: Skin is warm and dry.  Neurological:     General: No focal deficit present.     Mental Status: She is alert and oriented to person, place, and time.  Psychiatric:        Mood and Affect: Mood normal.        Behavior: Behavior normal.     DG Knee Complete 4 Views Left  Result Date: 10/10/2022 CLINICAL DATA:  Acute left knee pain and swelling after injury during martial arts. EXAM: LEFT KNEE - COMPLETE 4+ VIEW COMPARISON:  None Available. FINDINGS: No evidence of fracture or dislocation. Small suprapatellar joint effusion is noted. No evidence of arthropathy or other focal bone abnormality. Soft tissues are unremarkable. IMPRESSION: Small suprapatellar joint effusion.  No fracture or dislocation. Electronically Signed   By: Lupita Raider M.D.   On: 10/10/2022 19:34    A 4 inch Ace wrap was applied to the left knee.  Assessment and Plan :   PDMP not reviewed this encounter.  1. Acute pain of left knee   2. Knee strain, left, initial encounter    Recommended management for a left knee strain with RICE method, naproxen for pain and inflammation.  Follow-up with an orthopedist for further management including consideration for physical therapy, further imaging.  Counseled patient on  potential for adverse effects with medications prescribed/recommended today, ER and return-to-clinic precautions discussed, patient verbalized understanding.    Wallis Bamberg, [place] 10/13/22 [phone]

## 2022-10-13 DIAGNOSIS — S83512A Sprain of anterior cruciate ligament of left knee, initial encounter: Secondary | ICD-10-CM | POA: Diagnosis not present

## 2022-10-28 DIAGNOSIS — M25562 Pain in left knee: Secondary | ICD-10-CM | POA: Diagnosis not present

## 2022-11-01 DIAGNOSIS — S83512D Sprain of anterior cruciate ligament of left knee, subsequent encounter: Secondary | ICD-10-CM | POA: Diagnosis not present

## 2023-01-01 ENCOUNTER — Encounter (HOSPITAL_BASED_OUTPATIENT_CLINIC_OR_DEPARTMENT_OTHER): Payer: Self-pay | Admitting: Orthopedic Surgery

## 2023-01-01 ENCOUNTER — Other Ambulatory Visit: Payer: Self-pay

## 2023-01-03 NOTE — Progress Notes (Signed)

## 2023-01-05 NOTE — H&P (Signed)
PREOPERATIVE H&P  Chief Complaint: left knee pain  HPI: Katie Snow is a 47 y.o. female who was doing jujitsu on May 4th and felt a pop on the left knee.  It was not particularly painful when that happened, but it did force her to sit down.  She has noted some swelling.  She had x-rays at Urgent Care which were negative.  She is otherwise healthy.  She recently had an MRI demonstrating an ACL tear.  History reviewed. No pertinent past medical history. Past Surgical History:  Procedure Laterality Date   BREAST LUMPECTOMY     COLONOSCOPY     Social History   Socioeconomic History   Marital status: Married    Spouse name: Not on file   Number of children: Not on file   Years of education: Not on file   Highest education level: Not on file  Occupational History   Not on file  Tobacco Use   Smoking status: Former    Types: Cigarettes   Smokeless tobacco: Never  Vaping Use   Vaping status: Never Used  Substance and Sexual Activity   Alcohol use: Yes    Comment: occ   Drug use: Never   Sexual activity: Not on file  Other Topics Concern   Not on file  Social History Narrative   Not on file   Social Determinants of Health   Financial Resource Strain: Not on file  Food Insecurity: Not on file  Transportation Needs: Not on file  Physical Activity: Not on file  Stress: Not on file  Social Connections: Not on file   History reviewed. No pertinent family history. No Known Allergies Prior to Admission medications   Medication Sig Start Date End Date Taking? Authorizing Provider  Multiple Vitamin (MULTIVITAMIN ADULT PO) Take by mouth daily.   Yes [provider]  naproxen (NAPROSYN) 500 MG tablet Take 1 tablet (500 mg total) by mouth 2 (two) times daily with a meal. 10/10/22  Yes Wallis Bamberg, PA-C     Positive ROS: All other systems have been reviewed and were otherwise negative with the exception of those mentioned in the HPI and as above.  Physical Exam: General:  Alert, no acute distress Cardiovascular: No pedal edema Respiratory: No cyanosis, no use of accessory musculature GI: No organomegaly, abdomen is soft and non-tender Skin: No lesions in the area of chief complaint Neurologic: Sensation intact distally Psychiatric: Patient is competent for consent with normal mood and affect Lymphatic: No axillary or cervical lymphadenopathy  MUSCULOSKELETAL: On examination the left knee has 0-125 degrees of motion. Stable to varus and valgus stress. Positive Lachman.   MRI demonstrates an ACL tear . No real meniscal pathology.  Assessment: Left knee ACL tear   Plan: Plan for Procedure(s): KNEE ARTHROSCOPY WITH ANTERIOR CRUCIATE LIGAMENT (ACL) RECONSTRUCTION WITH TIBIAL ANTERIOR ALLOGRAFT  The risks benefits and alternatives were discussed with the patient including but not limited to the risks of nonoperative treatment, versus surgical intervention including infection, bleeding, nerve injury,  blood clots, cardiopulmonary complications, morbidity, mortality, among others, and they were willing to proceed.    Katie Sans, PA-C    01/05/2023 4:54 PM

## 2023-01-09 ENCOUNTER — Ambulatory Visit (HOSPITAL_BASED_OUTPATIENT_CLINIC_OR_DEPARTMENT_OTHER): Payer: 59

## 2023-01-09 ENCOUNTER — Ambulatory Visit (HOSPITAL_BASED_OUTPATIENT_CLINIC_OR_DEPARTMENT_OTHER): Payer: 59 | Admitting: Anesthesiology

## 2023-01-09 ENCOUNTER — Ambulatory Visit (HOSPITAL_BASED_OUTPATIENT_CLINIC_OR_DEPARTMENT_OTHER)
Admission: RE | Admit: 2023-01-09 | Discharge: 2023-01-09 | Disposition: A | Discharge status: Home / Self Care | Payer: 59 | Attending: Orthopedic Surgery | Admitting: Orthopedic Surgery

## 2023-01-09 ENCOUNTER — Other Ambulatory Visit: Payer: Self-pay

## 2023-01-09 ENCOUNTER — Encounter (HOSPITAL_BASED_OUTPATIENT_CLINIC_OR_DEPARTMENT_OTHER): Payer: Self-pay | Admitting: Orthopedic Surgery

## 2023-01-09 ENCOUNTER — Encounter (HOSPITAL_BASED_OUTPATIENT_CLINIC_OR_DEPARTMENT_OTHER)
Admission: RE | Disposition: A | Discharge status: Home / Self Care | Payer: Self-pay | Source: Home / Self Care | Attending: Orthopedic Surgery

## 2023-01-09 DIAGNOSIS — Z01818 Encounter for other preprocedural examination: Secondary | ICD-10-CM

## 2023-01-09 DIAGNOSIS — G8918 Other acute postprocedural pain: Secondary | ICD-10-CM | POA: Diagnosis not present

## 2023-01-09 DIAGNOSIS — S83512D Sprain of anterior cruciate ligament of left knee, subsequent encounter: Secondary | ICD-10-CM

## 2023-01-09 DIAGNOSIS — Y9375 Activity, martial arts: Secondary | ICD-10-CM | POA: Diagnosis not present

## 2023-01-09 DIAGNOSIS — Z87891 Personal history of nicotine dependence: Secondary | ICD-10-CM | POA: Diagnosis not present

## 2023-01-09 DIAGNOSIS — S83512A Sprain of anterior cruciate ligament of left knee, initial encounter: Secondary | ICD-10-CM

## 2023-01-09 LAB — POCT PREGNANCY, URINE: Preg Test, Ur: NEGATIVE

## 2023-01-09 SURGERY — KNEE ARTHROSCOPY WITH ANTERIOR CRUCIATE LIGAMENT (ACL) RECONSTRUCTION WITH HAMSTRING GRAFT
Anesthesia: General | Site: Knee | Laterality: Left

## 2023-01-09 MED ORDER — PROMETHAZINE HCL 25 MG/ML IJ SOLN: 6.2500 mg | INTRAMUSCULAR | Status: DC | PRN

## 2023-01-09 MED ORDER — PROPOFOL 500 MG/50ML IV EMUL
INTRAVENOUS | Status: AC
  Filled 2023-01-09: qty 50

## 2023-01-09 MED ORDER — FENTANYL CITRATE (PF) 100 MCG/2ML IJ SOLN
100.0000 ug | Freq: Once | INTRAMUSCULAR | Status: AC
  Administered 2023-01-09: 100 ug via INTRAVENOUS

## 2023-01-09 MED ORDER — FENTANYL CITRATE (PF) 100 MCG/2ML IJ SOLN
INTRAMUSCULAR | Status: AC
  Filled 2023-01-09: qty 2

## 2023-01-09 MED ORDER — ONDANSETRON HCL 4 MG PO TABS: 4.0000 mg | ORAL_TABLET | Freq: Three times a day (TID) | ORAL | 0 refills | Status: AC | PRN

## 2023-01-09 MED ORDER — POVIDONE-IODINE 10 % EX SWAB: 2.0000 | Freq: Once | CUTANEOUS | Status: DC

## 2023-01-09 MED ORDER — LIDOCAINE 2% (20 MG/ML) 5 ML SYRINGE
INTRAMUSCULAR | Status: AC
  Filled 2023-01-09: qty 5

## 2023-01-09 MED ORDER — OXYCODONE HCL 5 MG/5ML PO SOLN: 5.0000 mg | Freq: Once | ORAL | Status: AC | PRN

## 2023-01-09 MED ORDER — POVIDONE-IODINE 7.5 % EX SOLN
Freq: Once | CUTANEOUS | Status: DC
  Filled 2023-01-09: qty 118

## 2023-01-09 MED ORDER — ARTIFICIAL TEARS OPHTHALMIC OINT
TOPICAL_OINTMENT | OPHTHALMIC | Status: AC
  Filled 2023-01-09: qty 3.5

## 2023-01-09 MED ORDER — OXYCODONE HCL 5 MG PO TABS: 5.0000 mg | ORAL_TABLET | ORAL | 0 refills | Status: AC | PRN

## 2023-01-09 MED ORDER — MIDAZOLAM HCL 2 MG/2ML IJ SOLN
INTRAMUSCULAR | Status: AC
  Filled 2023-01-09: qty 2

## 2023-01-09 MED ORDER — ONDANSETRON HCL 4 MG/2ML IJ SOLN
INTRAMUSCULAR | Status: AC
  Filled 2023-01-09: qty 2

## 2023-01-09 MED ORDER — LIDOCAINE 2% (20 MG/ML) 5 ML SYRINGE
INTRAMUSCULAR | Status: DC | PRN
  Administered 2023-01-09: 40 mg via INTRAVENOUS

## 2023-01-09 MED ORDER — LACTATED RINGERS IV SOLN: INTRAVENOUS | Status: DC

## 2023-01-09 MED ORDER — KETOROLAC TROMETHAMINE 30 MG/ML IJ SOLN
INTRAMUSCULAR | Status: AC
  Filled 2023-01-09: qty 1

## 2023-01-09 MED ORDER — DEXAMETHASONE SODIUM PHOSPHATE 10 MG/ML IJ SOLN
INTRAMUSCULAR | Status: DC | PRN
  Administered 2023-01-09: 5 mg via INTRAVENOUS

## 2023-01-09 MED ORDER — DEXAMETHASONE SODIUM PHOSPHATE 10 MG/ML IJ SOLN
INTRAMUSCULAR | Status: AC
  Filled 2023-01-09: qty 1

## 2023-01-09 MED ORDER — ACETAMINOPHEN 500 MG PO TABS
1000.0000 mg | ORAL_TABLET | Freq: Once | ORAL | Status: AC
  Administered 2023-01-09: 1000 mg via ORAL

## 2023-01-09 MED ORDER — CEFAZOLIN SODIUM-DEXTROSE 2-4 GM/100ML-% IV SOLN
INTRAVENOUS | Status: AC
  Filled 2023-01-09: qty 100

## 2023-01-09 MED ORDER — PROPOFOL 10 MG/ML IV BOLUS
INTRAVENOUS | Status: DC | PRN
  Administered 2023-01-09: 200 mg via INTRAVENOUS

## 2023-01-09 MED ORDER — KETOROLAC TROMETHAMINE 30 MG/ML IJ SOLN
INTRAMUSCULAR | Status: DC | PRN
  Administered 2023-01-09: 30 mg via INTRAVENOUS

## 2023-01-09 MED ORDER — MEPERIDINE HCL 25 MG/ML IJ SOLN: 6.2500 mg | INTRAMUSCULAR | Status: DC | PRN

## 2023-01-09 MED ORDER — OXYCODONE HCL 5 MG PO TABS
ORAL_TABLET | ORAL | Status: AC
  Filled 2023-01-09: qty 1

## 2023-01-09 MED ORDER — HYDROMORPHONE HCL 1 MG/ML IJ SOLN: 0.2500 mg | INTRAMUSCULAR | Status: DC | PRN

## 2023-01-09 MED ORDER — SODIUM CHLORIDE 0.9 % IR SOLN
Status: DC | PRN
  Administered 2023-01-09: 6000

## 2023-01-09 MED ORDER — FENTANYL CITRATE (PF) 250 MCG/5ML IJ SOLN
INTRAMUSCULAR | Status: DC | PRN
  Administered 2023-01-09: 50 ug via INTRAVENOUS

## 2023-01-09 MED ORDER — MIDAZOLAM HCL 2 MG/2ML IJ SOLN
2.0000 mg | Freq: Once | INTRAMUSCULAR | Status: AC
  Administered 2023-01-09: 2 mg via INTRAVENOUS

## 2023-01-09 MED ORDER — CEFAZOLIN SODIUM-DEXTROSE 2-4 GM/100ML-% IV SOLN
2.0000 g | INTRAVENOUS | Status: AC
  Administered 2023-01-09: 2 g via INTRAVENOUS

## 2023-01-09 MED ORDER — OXYCODONE HCL 5 MG PO TABS
5.0000 mg | ORAL_TABLET | Freq: Once | ORAL | Status: AC | PRN
  Administered 2023-01-09: 5 mg via ORAL

## 2023-01-09 MED ORDER — ACETAMINOPHEN 500 MG PO TABS
ORAL_TABLET | ORAL | Status: AC
  Filled 2023-01-09: qty 2

## 2023-01-09 MED ORDER — ROPIVACAINE HCL 5 MG/ML IJ SOLN
INTRAMUSCULAR | Status: DC | PRN
  Administered 2023-01-09: 30 mL via PERINEURAL
  Administered 2023-01-09 (×2): 25 mL via PERINEURAL

## 2023-01-09 MED ORDER — BACLOFEN 10 MG PO TABS: 10.0000 mg | ORAL_TABLET | Freq: Three times a day (TID) | ORAL | 0 refills | Status: AC

## 2023-01-09 MED ORDER — ONDANSETRON HCL 4 MG/2ML IJ SOLN
INTRAMUSCULAR | Status: DC | PRN
  Administered 2023-01-09: 4 mg via INTRAVENOUS

## 2023-01-09 MED ORDER — AMISULPRIDE (ANTIEMETIC) 5 MG/2ML IV SOLN: 10.0000 mg | Freq: Once | INTRAVENOUS | Status: DC | PRN

## 2023-01-09 SURGICAL SUPPLY — 73 items
ANCHOR TIGHTROPE II 20 W/IB (Anchor) IMPLANT
BANDAGE ESMARK 6X9 LF (GAUZE/BANDAGES/DRESSINGS) ×1 IMPLANT
BLADE EXCALIBUR 4.0X13 (MISCELLANEOUS) IMPLANT
BLADE SURG 15 STRL LF DISP TIS (BLADE) ×1 IMPLANT
BLADE SURG 15 STRL SS (BLADE) ×1
BNDG CMPR 6 X 5 YARDS HK CLSR (GAUZE/BANDAGES/DRESSINGS) ×1
BNDG CMPR 9X6 STRL LF SNTH (GAUZE/BANDAGES/DRESSINGS) ×1
BNDG ELASTIC 6INX 5YD STR LF (GAUZE/BANDAGES/DRESSINGS) ×1 IMPLANT
BNDG ESMARK 6X9 LF (GAUZE/BANDAGES/DRESSINGS) ×1
BURR OVAL 8 FLU 4.0X13 (MISCELLANEOUS) IMPLANT
BURR OVAL 8 FLU 5.0X13 (MISCELLANEOUS) IMPLANT
CLSR STERI-STRIP ANTIMIC 1/2X4 (GAUZE/BANDAGES/DRESSINGS) ×1 IMPLANT
COVER BACK TABLE 60X90IN (DRAPES) ×1 IMPLANT
CUFF TOURN SGL QUICK 34 (TOURNIQUET CUFF)
CUFF TRNQT CYL 34X4.125X (TOURNIQUET CUFF) IMPLANT
DISSECTOR 3.8MM X 13CM (MISCELLANEOUS) ×1 IMPLANT
DISSECTOR 4.0MM X 13CM (MISCELLANEOUS) IMPLANT
DRAPE IMP U-DRAPE 54X76 (DRAPES) ×1 IMPLANT
DRAPE OEC MINIVIEW 54X84 (DRAPES) ×1 IMPLANT
DRAPE TOP ARMCOVERS (MISCELLANEOUS) ×1 IMPLANT
DRAPE U-SHAPE 47X51 STRL (DRAPES) ×1 IMPLANT
DRAPE-T ARTHROSCOPY W/POUCH (DRAPES) ×1 IMPLANT
DRILL FLIPCUTTER III 6-12 (ORTHOPEDIC DISPOSABLE SUPPLIES) IMPLANT
DURAPREP 26ML APPLICATOR (WOUND CARE) ×1 IMPLANT
ELECT REM PT RETURN 9FT ADLT (ELECTROSURGICAL) ×1
ELECTRODE REM PT RTRN 9FT ADLT (ELECTROSURGICAL) ×1 IMPLANT
EXCALIBUR 3.8MM X 13CM (MISCELLANEOUS) IMPLANT
FIBERSTICK 2 (SUTURE) IMPLANT
FLIPCUTTER III 6-12 AR-1204FF (ORTHOPEDIC DISPOSABLE SUPPLIES) ×1
GAUZE SPONGE 4X4 12PLY STRL (GAUZE/BANDAGES/DRESSINGS) ×1 IMPLANT
GLOVE BIO SURGEON STRL SZ7 (GLOVE) ×1 IMPLANT
GLOVE BIOGEL PI IND STRL 7.0 (GLOVE) ×1 IMPLANT
GLOVE BIOGEL PI IND STRL 8 (GLOVE) ×2 IMPLANT
GLOVE ORTHO TXT STRL SZ7.5 (GLOVE) ×1 IMPLANT
GOWN STRL REUS W/ TWL LRG LVL3 (GOWN DISPOSABLE) ×2 IMPLANT
GOWN STRL REUS W/ TWL XL LVL3 (GOWN DISPOSABLE) ×1 IMPLANT
GOWN STRL REUS W/TWL LRG LVL3 (GOWN DISPOSABLE) ×2
GOWN STRL REUS W/TWL XL LVL3 (GOWN DISPOSABLE) ×1
GRAFT TISS ANT TIB TNDN (Tissue) IMPLANT
IMMOBILIZER KNEE 22 UNIV (SOFTGOODS) ×1 IMPLANT
IMMOBILIZER KNEE 24 THIGH 36 (MISCELLANEOUS) ×1 IMPLANT
IMMOBILIZER KNEE 24 UNIV (MISCELLANEOUS) ×1
IV NS IRRIG 3000ML ARTHROMATIC (IV SOLUTION) ×2 IMPLANT
KIT TRANSTIBIAL (DISPOSABLE) IMPLANT
MANIFOLD NEPTUNE II (INSTRUMENTS) ×1 IMPLANT
NS IRRIG 1000ML POUR BTL (IV SOLUTION) ×1 IMPLANT
PACK ARTHROSCOPY DSU (CUSTOM PROCEDURE TRAY) ×1 IMPLANT
PACK BASIN DAY SURGERY FS (CUSTOM PROCEDURE TRAY) ×1 IMPLANT
PAD CAST 4YDX4 CTTN HI CHSV (CAST SUPPLIES) IMPLANT
PADDING CAST COTTON 4X4 STRL (CAST SUPPLIES)
PADDING CAST COTTON 6X4 STRL (CAST SUPPLIES) ×1 IMPLANT
PENCIL SMOKE EVACUATOR (MISCELLANEOUS) IMPLANT
PORT APPOLLO RF 90DEGREE MULTI (SURGICAL WAND) ×1 IMPLANT
SCREW BIOCOMPOSITE 8X20 INTER (Screw) IMPLANT
SLEEVE SCD COMPRESS KNEE MED (STOCKING) ×1 IMPLANT
SPIKE FLUID TRANSFER (MISCELLANEOUS) IMPLANT
SPONGE T-LAP 4X18 ~~LOC~~+RFID (SPONGE) ×1 IMPLANT
SUCTION TUBE FRAZIER 10FR DISP (SUCTIONS) IMPLANT
SUT MNCRL AB 4-0 PS2 18 (SUTURE) IMPLANT
SUT VIC AB 0 CT1 27 (SUTURE) ×3
SUT VIC AB 0 CT1 27XBRD ANBCTR (SUTURE) ×3 IMPLANT
SUT VIC AB 2-0 SH 27 (SUTURE)
SUT VIC AB 2-0 SH 27XBRD (SUTURE) IMPLANT
SUT VIC AB 3-0 SH 27 (SUTURE)
SUT VIC AB 3-0 SH 27X BRD (SUTURE) IMPLANT
SUT VIC AB 4-0 PS2 18 (SUTURE) IMPLANT
SUT VICRYL 3-0 CR8 SH (SUTURE) ×1 IMPLANT
SUTURE TAPE 1.3 FIBERLOP 20 ST (SUTURE) ×2 IMPLANT
SUTURETAPE 1.3 FIBERLOOP 20 ST (SUTURE) ×2
TENDON ANTERIOR TIBIALIS (Tissue) ×1 IMPLANT
TOWEL GREEN STERILE FF (TOWEL DISPOSABLE) ×3 IMPLANT
TUBING ARTHROSCOPY IRRIG 16FT (MISCELLANEOUS) ×1 IMPLANT
WRAP KNEE MAXI GEL POST OP (GAUZE/BANDAGES/DRESSINGS) ×1 IMPLANT

## 2023-01-09 NOTE — Progress Notes (Signed)
AssistedDr. Sabra Heck with left, adductor canal, ultrasound guided block. Side rails up, monitors on throughout procedure. See vital signs in flow sheet. Tolerated Procedure well. ? ?

## 2023-01-09 NOTE — Op Note (Signed)
01/09/2023  9:14 AM  PATIENT:  Katie Snow    PRE-OPERATIVE DIAGNOSIS:  Anterior cruciate ligament tear, left knee  POST-OPERATIVE DIAGNOSIS:  Same  PROCEDURE: Left knee arthroscopy, anterior cruciate ligament reconstruction 2 views left knee  SURGEON:  Teryl Lucy, MD  PHYSICIAN ASSISTANT: Janine Ores, PA-C, present and scrubbed throughout the case, critical for completion in a timely fashion, and for retraction, instrumentation, and closure.  ANESTHESIA:   General  ESTIMATED BLOOD LOSS: Minimal  PREOPERATIVE INDICATIONS:  Katie Snow is a  47 y.o. female who tore their ACL and failed conservative measures and elected for surgical management.    The risks benefits and alternatives were discussed with the patient preoperatively including but not limited to the risks of infection, bleeding, nerve injury, stiffness, cardiopulmonary complications, the need for revision surgery, recurrent instability, progression of arthritis, the potential for use of a allograft and related disease transmission risks, among others and the patient was willing to proceed.    OPERATIVE IMPLANTS:   Implant Name Type Inv. Item Serial No. Manufacturer Lot No. LRB No. Used Action  TENDON ANTERIOR TIBIALIS - [id number] Tissue TENDON ANTERIOR TIBIALIS [id number] [facility] [id number] Left 1 Implanted  ANCHOR TIGHTROPE II 20 W/IB - [id number] Anchor ANCHOR TIGHTROPE II 20 W/IB  [employer] [id number] Left 1 Implanted  SCREW BIOCOMPOSITE 8X20 INTER - [id number] Screw SCREW BIOCOMPOSITE 8X20 INTER  [employer] [id number] Left 1 Implanted    OPERATIVE FINDINGS: The anterior cruciate ligament was completely torn. The PCL was intact. The posterior lateral corner was intact to dial testing.  All the articular cartilage surfaces were normal, the menisci were normal.  UNIQUE ASPECTS OF THE CASE: Her anatomy was fairly small, and even the 8.5 mm graft filled up the tibial footprint.  OPERATIVE PROCEDURE: The  patient was brought to the operating room and placed in the supine position. General anesthesia was administered. IV antibiotics were given. The lower extremity was prepped and draped in usual sterile fashion. Exam under anesthesia demonstrated the above-named findings. Time out was performed.  The leg was elevated and exsanguinated and the tourniquet was inflated. Incision was made over the proximal tibia.   Knee arthroscopy was then performed, and the above named findings were noted.    I then removed the previous anterior cruciate ligament stump, and performed a mild notchplasty.  The outside in guide was then applied to the appropriate position and the retro-cutter was used to drill the femoral socket. Care was taken to maintain the cortical bridge.  The graft measured 20.5  I then drilled the tibial tunnel using the retro-cutter, and opened the cortex with a reamer. All the soft tissue remnants were removed and cleaned at the aperture of the tunnel.  I also dilated with the appropriate dilators.  The passing suture was delivered through the tibia, and then the button and graft delivered up into the femoral tunnel.  The button was flipped and confirmed under live fluoroscopy. I then tensioned the anterior cruciate ligament tightrope, and deliver the graft up into the femoral tunnel. Over 20 mm of graft was in the femoral tunnel. I confirmed once more with the fluoroscopy that the button was flipped appropriately on the femoral cortex.  I then cycled the knee, eliminated all of the creep, and I had excellent isometry. I then applied tension, and the [employer] bio composite interference screw into the tibia placing a reverse Lachman maneuver on the tibia and femur. I removed the guide pin prior to completely seating the  screw.  2 VIEWS OF THE left knee taken postoperatively demonstrated appropriate position of the tunnels and button and screw.  Excellent fixation was achieved on both the femoral  and tibial side, and the wounds were irrigated copiously and the sartorius fascia repaired with Vicryl, and the portals repaired with Monocryl with Steri-Strips and sterile gauze.  The patient was awakened and returned to PACU in stable and satisfactory condition. There were no complications and She tolerated the procedure well.

## 2023-01-09 NOTE — Anesthesia Procedure Notes (Signed)
Procedure Name: LMA Insertion Date/Time: 01/09/2023 7:39 AM  Performed by: Demetrio Lapping, CRNAPre-anesthesia Checklist: Patient identified, Emergency Drugs available, Suction available and Patient being monitored Patient Re-evaluated:Patient Re-evaluated prior to induction Oxygen Delivery Method: Circle System Utilized Preoxygenation: Pre-oxygenation with 100% oxygen Induction Type: IV induction Ventilation: Mask ventilation without difficulty LMA: LMA inserted LMA Size: 4.0 Number of attempts: 1 Airway Equipment and Method: Bite block Placement Confirmation: positive ETCO2 Tube secured with: Tape Dental Injury: Teeth and Oropharynx as per pre-operative assessment

## 2023-01-09 NOTE — Anesthesia Preprocedure Evaluation (Signed)
Anesthesia Evaluation  Patient identified by MRN, date of birth, ID band Patient awake    Reviewed: Allergy & Precautions, H&P , NPO status , Patient's Chart, lab work & pertinent test results  Airway Mallampati: II  TM Distance: >3 FB Neck ROM: Full    Dental no notable dental hx.    Pulmonary neg pulmonary ROS, former smoker   Pulmonary exam normal breath sounds clear to auscultation       Cardiovascular negative cardio ROS Normal cardiovascular exam Rhythm:Regular Rate:Normal     Neuro/Psych negative neurological ROS  negative psych ROS   GI/Hepatic negative GI ROS, Neg liver ROS,,,  Endo/Other  negative endocrine ROS    Renal/GU negative Renal ROS  negative genitourinary   Musculoskeletal negative musculoskeletal ROS (+)    Abdominal   Peds negative pediatric ROS (+)  Hematology negative hematology ROS (+)   Anesthesia Other Findings   Reproductive/Obstetrics negative OB ROS                             Anesthesia Physical Anesthesia Plan  ASA: 2  Anesthesia Plan: General   Post-op Pain Management: Regional block* and Tylenol PO (pre-op)*   Induction: Intravenous  PONV Risk Score and Plan: 3 and Ondansetron, Dexamethasone, Midazolam and Treatment may vary due to age or medical condition  Airway Management Planned: LMA  Additional Equipment:   Intra-op Plan:   Post-operative Plan: Extubation in OR  Informed Consent: I have reviewed the patients History and Physical, chart, labs and discussed the procedure including the risks, benefits and alternatives for the proposed anesthesia with the patient or authorized representative who has indicated his/her understanding and acceptance.     Dental advisory given  Plan Discussed with: CRNA  Anesthesia Plan Comments:        Anesthesia Quick Evaluation

## 2023-01-09 NOTE — Discharge Instructions (Addendum)
May have Tylenol at 12:34 if needed.   Post Anesthesia Home Care Instructions  Activity: Get plenty of rest for the remainder of the day. A responsible individual must stay with you for 24 hours following the procedure.  For the next 24 hours, DO NOT: -Drive a car -Advertising copywriter -Drink alcoholic beverages -Take any medication unless instructed by your physician -Make any legal decisions or sign important papers.  Meals: Start with liquid foods such as gelatin or soup. Progress to regular foods as tolerated. Avoid greasy, spicy, heavy foods. If nausea and/or vomiting occur, drink only clear liquids until the nausea and/or vomiting subsides. Call your physician if vomiting continues.  Special Instructions/Symptoms: Your throat may feel dry or sore from the anesthesia or the breathing tube placed in your throat during surgery. If this causes discomfort, gargle with warm salt water. The discomfort should disappear within 24 hours.  If you had a scopolamine patch placed behind your ear for the management of post- operative nausea and/or vomiting:  1. The medication in the patch is effective for 72 hours, after which it should be removed.  Wrap patch in a tissue and discard in the trash. Wash hands thoroughly with soap and water. 2. You may remove the patch earlier than 72 hours if you experience unpleasant side effects which may include dry mouth, dizziness or visual disturbances. 3. Avoid touching the patch. Wash your hands with soap and water after contact with the patch.    ACL Reconstruction Post-Operative Instructions   Diet: Start with some clear liquids, soups, etc, and advance to your regular diet as tolerated.  Dressing: You may remove your dressing 3-5 days after surgery and shower. There are steri-strips (white strips) over the incisions. Your stitches are absorbable. Leave the steri-strips in place when changing your dressings, they will peel off with time, usually 2-3  weeks. Keep your wounds covered with band-aids/gauze until your first post-op appointment.  Activity: Increase activity slowly as tolerated, but follow the weight bearing instructions below. You cannot drive while taking narcotic medications. You may bend and straighten the leg as soon as you feel comfortable.   Weight Bearing: As tolerated  Medications: You will want to take some of your pain medications tonight before going to bed to make sure you have something in your system when the numbing medicine/block wears off. The maximum dose of Tylenol (Acetaminophen) in a day is 3000-4000 mg, and beware that your pain medication may have Tylenol (acetaminophen) in it. As your pain improves, you can begin to taper the amount of narcotic you are using. You may want to avoid using ibuprofen/motrin/NSAIDs for the first 4-6 weeks, as they can slow down tendon and bone healing.  To prevent constipation: you may use a stool softener such as - Colace (over the counter) 100 mg by mouth twice a day Drink plenty of fluids (prune juice may be helpful) and high fiber foods Miralax (over the counter) for constipation as needed.  Itching: If you are itching or having other side effects with your pain medications, try taking only a single pain pill, or even half a pain pill at a time. You can also use Benadryl for itching or sleep.  Precautions: If you experience chest pain or shortness of breath - call 911 immediately for transfer tothe hospital emergency department!! If you develop a fever greater that 101 F, purulent drainage from wound, increased redness or drainagefrom wound, or calf pain -- Call the office at [phone]  Follow- Up  Appointment: Please call for an appointment to be seen in 2 weeks [phone] in [place].  After-Hours: We have an Urgent Care available for after-hours emergencies located at the [facility] office at [facility] in [place] open from 5:30p-9p every night,  and from 10a-2p on Saturday and Sunday. There is also an on call physician 24-7 for emergencies that can be reached at [phone].

## 2023-01-09 NOTE — Anesthesia Postprocedure Evaluation (Signed)
Anesthesia Post Note  Patient: Katie Snow  Procedure(s) Performed: KNEE ARTHROSCOPY WITH ANTERIOR CRUCIATE LIGAMENT (ACL) RECONSTRUCTION WITH TIBIAL ANTERIOR ALLOGRAFT (Left: Knee)     Patient location during evaluation: PACU Anesthesia Type: General Level of consciousness: awake and alert Pain management: pain level controlled Vital Signs Assessment: post-procedure vital signs reviewed and stable Respiratory status: spontaneous breathing, nonlabored ventilation and respiratory function stable Cardiovascular status: blood pressure returned to baseline and stable Postop Assessment: no apparent nausea or vomiting Anesthetic complications: no   No notable events documented.  Last Vitals:  Vitals:   01/09/23 1000 01/09/23 1043  BP: 139/87 (!) 144/76  Pulse:  94  Resp:  16  Temp:  36.4 C  SpO2:  98%    Last Pain:  Vitals:   01/09/23 1041  TempSrc:   PainSc: 4                  Lowella Curb

## 2023-01-09 NOTE — Anesthesia Procedure Notes (Signed)
Anesthesia Regional Block: Adductor canal block   Pre-Anesthetic Checklist: , timeout performed,  Correct Patient, Correct Site, Correct Laterality,  Correct Procedure, Correct Position, site marked,  Risks and benefits discussed,  Surgical consent,  Pre-op evaluation,  At surgeon's request and post-op pain management  Laterality: Left  Prep: chloraprep       Needles:  Injection technique: Single-shot  Needle Type: Stimiplex     Needle Length: 9cm  Needle Gauge: 21     Additional Needles:   Procedures:,,,, ultrasound used (permanent image in chart),,    Narrative:  Start time: 01/09/2023 6:54 AM End time: 01/09/2023 6:59 AM Injection made incrementally with aspirations every 5 mL.  Performed by: Personally  Anesthesiologist: Lowella Curb, MD

## 2023-01-09 NOTE — Transfer of Care (Signed)
Immediate Anesthesia Transfer of Care Note  Patient: Katie Snow  Procedure(s) Performed: KNEE ARTHROSCOPY WITH ANTERIOR CRUCIATE LIGAMENT (ACL) RECONSTRUCTION WITH TIBIAL ANTERIOR ALLOGRAFT (Left: Knee)  Patient Location: PACU  Anesthesia Type:General  Level of Consciousness: drowsy  Airway & Oxygen Therapy: Patient Spontanous Breathing and Patient connected to nasal cannula oxygen  Post-op Assessment: Report given to RN and Post -op Vital signs reviewed and stable  Post vital signs: Reviewed and stable  Last Vitals:  Vitals Value Taken Time  BP 108/66   Temp    Pulse 68 01/09/23 0931  Resp 54   SpO2 98 % 01/09/23 0931  Vitals shown include unfiled device data.  Last Pain:  Vitals:   01/09/23 0631  TempSrc: Temporal  PainSc: 0-No pain         Complications: No notable events documented.

## 2023-01-09 NOTE — Interval H&P Note (Signed)
History and Physical Interval Note:  01/09/2023 7:16 AM  Katie Snow  has presented today for surgery, with the diagnosis of LEFT ACL TEAR.  The various methods of treatment have been discussed with the patient and family. After consideration of risks, benefits and other options for treatment, the patient has consented to  Procedure(s): KNEE ARTHROSCOPY WITH ANTERIOR CRUCIATE LIGAMENT (ACL) RECONSTRUCTION WITH TIBIAL ANTERIOR ALLOGRAFT (Left) as a surgical intervention.  The patient's history has been reviewed, patient examined, no change in status, stable for surgery.  I have reviewed the patient's chart and labs.  Questions were answered to the patient's satisfaction.     Eulas Post

## 2023-01-22 DIAGNOSIS — S83512D Sprain of anterior cruciate ligament of left knee, subsequent encounter: Secondary | ICD-10-CM | POA: Diagnosis not present

## 2023-01-30 DIAGNOSIS — R262 Difficulty in walking, not elsewhere classified: Secondary | ICD-10-CM | POA: Diagnosis not present

## 2023-01-30 DIAGNOSIS — M25662 Stiffness of left knee, not elsewhere classified: Secondary | ICD-10-CM | POA: Diagnosis not present

## 2023-01-30 DIAGNOSIS — S83512D Sprain of anterior cruciate ligament of left knee, subsequent encounter: Secondary | ICD-10-CM | POA: Diagnosis not present

## 2023-01-30 DIAGNOSIS — M6281 Muscle weakness (generalized): Secondary | ICD-10-CM | POA: Diagnosis not present

## 2023-02-01 DIAGNOSIS — M6281 Muscle weakness (generalized): Secondary | ICD-10-CM | POA: Diagnosis not present

## 2023-02-01 DIAGNOSIS — S83512D Sprain of anterior cruciate ligament of left knee, subsequent encounter: Secondary | ICD-10-CM | POA: Diagnosis not present

## 2023-02-01 DIAGNOSIS — R262 Difficulty in walking, not elsewhere classified: Secondary | ICD-10-CM | POA: Diagnosis not present

## 2023-02-01 DIAGNOSIS — M25662 Stiffness of left knee, not elsewhere classified: Secondary | ICD-10-CM | POA: Diagnosis not present

## 2023-02-08 DIAGNOSIS — M6281 Muscle weakness (generalized): Secondary | ICD-10-CM | POA: Diagnosis not present

## 2023-02-08 DIAGNOSIS — R262 Difficulty in walking, not elsewhere classified: Secondary | ICD-10-CM | POA: Diagnosis not present

## 2023-02-08 DIAGNOSIS — M25662 Stiffness of left knee, not elsewhere classified: Secondary | ICD-10-CM | POA: Diagnosis not present

## 2023-02-08 DIAGNOSIS — S83512D Sprain of anterior cruciate ligament of left knee, subsequent encounter: Secondary | ICD-10-CM | POA: Diagnosis not present

## 2023-02-14 DIAGNOSIS — M25662 Stiffness of left knee, not elsewhere classified: Secondary | ICD-10-CM | POA: Diagnosis not present

## 2023-02-14 DIAGNOSIS — S83512D Sprain of anterior cruciate ligament of left knee, subsequent encounter: Secondary | ICD-10-CM | POA: Diagnosis not present

## 2023-02-14 DIAGNOSIS — R262 Difficulty in walking, not elsewhere classified: Secondary | ICD-10-CM | POA: Diagnosis not present

## 2023-02-14 DIAGNOSIS — M6281 Muscle weakness (generalized): Secondary | ICD-10-CM | POA: Diagnosis not present

## 2023-02-16 DIAGNOSIS — M25662 Stiffness of left knee, not elsewhere classified: Secondary | ICD-10-CM | POA: Diagnosis not present

## 2023-02-16 DIAGNOSIS — S83512D Sprain of anterior cruciate ligament of left knee, subsequent encounter: Secondary | ICD-10-CM | POA: Diagnosis not present

## 2023-02-16 DIAGNOSIS — R262 Difficulty in walking, not elsewhere classified: Secondary | ICD-10-CM | POA: Diagnosis not present

## 2023-02-16 DIAGNOSIS — M6281 Muscle weakness (generalized): Secondary | ICD-10-CM | POA: Diagnosis not present

## 2023-02-21 DIAGNOSIS — M25662 Stiffness of left knee, not elsewhere classified: Secondary | ICD-10-CM | POA: Diagnosis not present

## 2023-02-21 DIAGNOSIS — R262 Difficulty in walking, not elsewhere classified: Secondary | ICD-10-CM | POA: Diagnosis not present

## 2023-02-21 DIAGNOSIS — S83512D Sprain of anterior cruciate ligament of left knee, subsequent encounter: Secondary | ICD-10-CM | POA: Diagnosis not present

## 2023-02-21 DIAGNOSIS — M6281 Muscle weakness (generalized): Secondary | ICD-10-CM | POA: Diagnosis not present

## 2023-02-22 DIAGNOSIS — Z01419 Encounter for gynecological examination (general) (routine) without abnormal findings: Secondary | ICD-10-CM | POA: Diagnosis not present

## 2023-02-22 DIAGNOSIS — Z6826 Body mass index (BMI) 26.0-26.9, adult: Secondary | ICD-10-CM | POA: Diagnosis not present

## 2023-02-22 DIAGNOSIS — Z1231 Encounter for screening mammogram for malignant neoplasm of breast: Secondary | ICD-10-CM | POA: Diagnosis not present

## 2023-02-23 ENCOUNTER — Other Ambulatory Visit: Payer: Self-pay | Admitting: Obstetrics and Gynecology

## 2023-02-23 DIAGNOSIS — Z803 Family history of malignant neoplasm of breast: Secondary | ICD-10-CM

## 2023-02-23 DIAGNOSIS — M25662 Stiffness of left knee, not elsewhere classified: Secondary | ICD-10-CM | POA: Diagnosis not present

## 2023-02-23 DIAGNOSIS — S83512D Sprain of anterior cruciate ligament of left knee, subsequent encounter: Secondary | ICD-10-CM | POA: Diagnosis not present

## 2023-02-23 DIAGNOSIS — Z8249 Family history of ischemic heart disease and other diseases of the circulatory system: Secondary | ICD-10-CM

## 2023-02-23 DIAGNOSIS — M6281 Muscle weakness (generalized): Secondary | ICD-10-CM | POA: Diagnosis not present

## 2023-02-23 DIAGNOSIS — R262 Difficulty in walking, not elsewhere classified: Secondary | ICD-10-CM | POA: Diagnosis not present

## 2023-03-02 DIAGNOSIS — M25662 Stiffness of left knee, not elsewhere classified: Secondary | ICD-10-CM | POA: Diagnosis not present

## 2023-03-02 DIAGNOSIS — M6281 Muscle weakness (generalized): Secondary | ICD-10-CM | POA: Diagnosis not present

## 2023-03-02 DIAGNOSIS — S83512D Sprain of anterior cruciate ligament of left knee, subsequent encounter: Secondary | ICD-10-CM | POA: Diagnosis not present

## 2023-03-02 DIAGNOSIS — R262 Difficulty in walking, not elsewhere classified: Secondary | ICD-10-CM | POA: Diagnosis not present

## 2023-03-06 DIAGNOSIS — Z1321 Encounter for screening for nutritional disorder: Secondary | ICD-10-CM | POA: Diagnosis not present

## 2023-03-06 DIAGNOSIS — Z13220 Encounter for screening for lipoid disorders: Secondary | ICD-10-CM | POA: Diagnosis not present

## 2023-03-06 DIAGNOSIS — Z131 Encounter for screening for diabetes mellitus: Secondary | ICD-10-CM | POA: Diagnosis not present

## 2023-03-06 DIAGNOSIS — Z13228 Encounter for screening for other metabolic disorders: Secondary | ICD-10-CM | POA: Diagnosis not present

## 2023-03-06 DIAGNOSIS — Z1329 Encounter for screening for other suspected endocrine disorder: Secondary | ICD-10-CM | POA: Diagnosis not present

## 2023-03-07 DIAGNOSIS — R262 Difficulty in walking, not elsewhere classified: Secondary | ICD-10-CM | POA: Diagnosis not present

## 2023-03-07 DIAGNOSIS — S83512D Sprain of anterior cruciate ligament of left knee, subsequent encounter: Secondary | ICD-10-CM | POA: Diagnosis not present

## 2023-03-07 DIAGNOSIS — M6281 Muscle weakness (generalized): Secondary | ICD-10-CM | POA: Diagnosis not present

## 2023-03-07 DIAGNOSIS — M25662 Stiffness of left knee, not elsewhere classified: Secondary | ICD-10-CM | POA: Diagnosis not present

## 2023-03-14 DIAGNOSIS — M6281 Muscle weakness (generalized): Secondary | ICD-10-CM | POA: Diagnosis not present

## 2023-03-14 DIAGNOSIS — M25662 Stiffness of left knee, not elsewhere classified: Secondary | ICD-10-CM | POA: Diagnosis not present

## 2023-03-14 DIAGNOSIS — R262 Difficulty in walking, not elsewhere classified: Secondary | ICD-10-CM | POA: Diagnosis not present

## 2023-03-14 DIAGNOSIS — S83512D Sprain of anterior cruciate ligament of left knee, subsequent encounter: Secondary | ICD-10-CM | POA: Diagnosis not present

## 2023-03-21 DIAGNOSIS — S83512A Sprain of anterior cruciate ligament of left knee, initial encounter: Secondary | ICD-10-CM | POA: Diagnosis not present

## 2023-03-28 DIAGNOSIS — S83512D Sprain of anterior cruciate ligament of left knee, subsequent encounter: Secondary | ICD-10-CM | POA: Diagnosis not present

## 2023-03-28 DIAGNOSIS — M6281 Muscle weakness (generalized): Secondary | ICD-10-CM | POA: Diagnosis not present

## 2023-03-28 DIAGNOSIS — R262 Difficulty in walking, not elsewhere classified: Secondary | ICD-10-CM | POA: Diagnosis not present

## 2023-03-28 DIAGNOSIS — M25662 Stiffness of left knee, not elsewhere classified: Secondary | ICD-10-CM | POA: Diagnosis not present

## 2023-04-11 DIAGNOSIS — R262 Difficulty in walking, not elsewhere classified: Secondary | ICD-10-CM | POA: Diagnosis not present

## 2023-04-11 DIAGNOSIS — M6281 Muscle weakness (generalized): Secondary | ICD-10-CM | POA: Diagnosis not present

## 2023-04-11 DIAGNOSIS — M25662 Stiffness of left knee, not elsewhere classified: Secondary | ICD-10-CM | POA: Diagnosis not present

## 2023-04-11 DIAGNOSIS — S83512D Sprain of anterior cruciate ligament of left knee, subsequent encounter: Secondary | ICD-10-CM | POA: Diagnosis not present

## 2023-05-10 DIAGNOSIS — R262 Difficulty in walking, not elsewhere classified: Secondary | ICD-10-CM | POA: Diagnosis not present

## 2023-05-10 DIAGNOSIS — M6281 Muscle weakness (generalized): Secondary | ICD-10-CM | POA: Diagnosis not present

## 2023-05-10 DIAGNOSIS — M25662 Stiffness of left knee, not elsewhere classified: Secondary | ICD-10-CM | POA: Diagnosis not present

## 2023-05-10 DIAGNOSIS — S83512D Sprain of anterior cruciate ligament of left knee, subsequent encounter: Secondary | ICD-10-CM | POA: Diagnosis not present

## 2023-05-11 DIAGNOSIS — S83512A Sprain of anterior cruciate ligament of left knee, initial encounter: Secondary | ICD-10-CM | POA: Diagnosis not present

## 2024-04-02 ENCOUNTER — Other Ambulatory Visit: Payer: Self-pay | Admitting: Obstetrics and Gynecology

## 2024-04-02 DIAGNOSIS — Z8249 Family history of ischemic heart disease and other diseases of the circulatory system: Secondary | ICD-10-CM

## 2024-04-03 ENCOUNTER — Other Ambulatory Visit: Payer: Self-pay | Admitting: Obstetrics and Gynecology

## 2024-04-03 DIAGNOSIS — Z803 Family history of malignant neoplasm of breast: Secondary | ICD-10-CM
